# Patient Record
Sex: Male | Born: 1975 | Race: White | Hispanic: No | Marital: Single
Health system: Southern US, Community
[De-identification: ages and names within clinical notes are randomized; demographics above are authoritative.]

---

## 2006-06-28 ENCOUNTER — Emergency Department (HOSPITAL_COMMUNITY): Admission: EM | Admit: 2006-06-28 | Discharge: 2006-06-28 | Payer: Self-pay | Admitting: Emergency Medicine

## 2008-05-21 ENCOUNTER — Emergency Department (HOSPITAL_COMMUNITY): Admission: EM | Admit: 2008-05-21 | Discharge: 2008-05-22 | Payer: Self-pay | Admitting: Emergency Medicine

## 2010-08-05 ENCOUNTER — Emergency Department (HOSPITAL_COMMUNITY): Admission: EM | Admit: 2010-08-05 | Discharge: 2010-08-06 | Payer: Self-pay | Admitting: Emergency Medicine

## 2010-11-25 LAB — CBC
HCT: 37.5 % — ABNORMAL LOW (ref 39.0–52.0)
Hemoglobin: 13.8 g/dL (ref 13.0–17.0)
MCH: 32.4 pg (ref 26.0–34.0)
MCHC: 36.8 g/dL — ABNORMAL HIGH (ref 30.0–36.0)
MCV: 88 fL (ref 78.0–100.0)
Platelets: 192 10*3/uL (ref 150–400)
RBC: 4.26 MIL/uL (ref 4.22–5.81)
RDW: 12.5 % (ref 11.5–15.5)
WBC: 10.2 10*3/uL (ref 4.0–10.5)

## 2010-11-25 LAB — DIFFERENTIAL
Basophils Absolute: 0 10*3/uL (ref 0.0–0.1)
Basophils Relative: 0 % (ref 0–1)
Eosinophils Absolute: 0.1 10*3/uL (ref 0.0–0.7)
Eosinophils Relative: 1 % (ref 0–5)
Lymphocytes Relative: 44 % (ref 12–46)
Lymphs Abs: 4.5 10*3/uL — ABNORMAL HIGH (ref 0.7–4.0)
Monocytes Absolute: 0.7 10*3/uL (ref 0.1–1.0)
Monocytes Relative: 7 % (ref 3–12)
Neutro Abs: 4.8 10*3/uL (ref 1.7–7.7)
Neutrophils Relative %: 47 % (ref 43–77)

## 2010-11-25 LAB — RAPID URINE DRUG SCREEN, HOSP PERFORMED
Amphetamines: NOT DETECTED
Barbiturates: NOT DETECTED
Benzodiazepines: POSITIVE — AB
Cocaine: NOT DETECTED
Opiates: POSITIVE — AB
Tetrahydrocannabinol: POSITIVE — AB

## 2010-11-25 LAB — URINALYSIS, ROUTINE W REFLEX MICROSCOPIC
Bilirubin Urine: NEGATIVE
Glucose, UA: NEGATIVE mg/dL
Hgb urine dipstick: NEGATIVE
Ketones, ur: NEGATIVE mg/dL
Nitrite: NEGATIVE
Protein, ur: NEGATIVE mg/dL
Specific Gravity, Urine: 1.01 (ref 1.005–1.030)
Urobilinogen, UA: 0.2 mg/dL (ref 0.0–1.0)
pH: 5.5 (ref 5.0–8.0)

## 2010-11-25 LAB — BASIC METABOLIC PANEL WITH GFR
CO2: 28 meq/L (ref 19–32)
Calcium: 8.1 mg/dL — ABNORMAL LOW (ref 8.4–10.5)
GFR calc Af Amer: >60 mL/min (ref >60)
GFR calc non Af Amer: >60 mL/min (ref >60)
Sodium: 147 meq/L — ABNORMAL HIGH (ref 135–145)

## 2010-11-25 LAB — BASIC METABOLIC PANEL
BUN: 6 mg/dL (ref 6–23)
Chloride: 114 mEq/L — ABNORMAL HIGH (ref 96–112)
Creatinine, Ser: 0.7 mg/dL (ref 0.4–1.5)
Glucose, Bld: 107 mg/dL — ABNORMAL HIGH (ref 70–99)
Potassium: 4.5 mEq/L (ref 3.5–5.1)

## 2010-11-25 LAB — ETHANOL: Alcohol, Ethyl (B): 203 mg/dL — ABNORMAL HIGH (ref 0–10)

## 2011-06-17 LAB — CBC
HCT: 43.7
Hemoglobin: 15.2
MCHC: 34.8
MCV: 95.2
Platelets: 256
RBC: 4.59
RDW: 13.1
WBC: 9.2

## 2011-06-17 LAB — BASIC METABOLIC PANEL WITH GFR
CO2: 24
Chloride: 111
Glucose, Bld: 110 — ABNORMAL HIGH
Potassium: 3.3 — ABNORMAL LOW
Sodium: 141

## 2011-06-17 LAB — POCT CARDIAC MARKERS
CKMB, poc: <1 — ABNORMAL LOW
Myoglobin, poc: 52.2
Troponin i, poc: <0.05

## 2011-06-17 LAB — DIFFERENTIAL
Basophils Absolute: 0.1
Basophils Relative: 1
Eosinophils Absolute: 0.1
Eosinophils Relative: 2
Lymphocytes Relative: 37
Lymphs Abs: 3.4
Monocytes Absolute: 0.6
Monocytes Relative: 6
Neutro Abs: 4.9
Neutrophils Relative %: 54

## 2011-06-17 LAB — BASIC METABOLIC PANEL
BUN: 3 — ABNORMAL LOW
Calcium: 8.3 — ABNORMAL LOW
Creatinine, Ser: 0.63
GFR calc Af Amer: >60
GFR calc non Af Amer: >60

## 2013-02-24 ENCOUNTER — Emergency Department: Payer: Self-pay | Admitting: Emergency Medicine

## 2013-02-24 LAB — COMPREHENSIVE METABOLIC PANEL WITH GFR
Albumin: 4.1 g/dL (ref 3.4–5.0)
Alkaline Phosphatase: 93 U/L (ref 50–136)
Anion Gap: 8 (ref 7–16)
BUN: 10 mg/dL (ref 7–18)
Bilirubin,Total: 0.5 mg/dL (ref 0.2–1.0)
Calcium, Total: 8.7 mg/dL (ref 8.5–10.1)
Chloride: 105 mmol/L (ref 98–107)
Co2: 26 mmol/L (ref 21–32)
Creatinine: 0.86 mg/dL (ref 0.60–1.30)
EGFR (African American): >60
EGFR (Non-African Amer.): >60
Glucose: 87 mg/dL (ref 65–99)
Osmolality: 276 (ref 275–301)
Potassium: 3.7 mmol/L (ref 3.5–5.1)
SGOT(AST): 22 U/L (ref 15–37)
SGPT (ALT): 18 U/L (ref 12–78)
Sodium: 139 mmol/L (ref 136–145)
Total Protein: 7.5 g/dL (ref 6.4–8.2)

## 2013-02-24 LAB — ACETAMINOPHEN LEVEL: Acetaminophen: 2 ug/mL — ABNORMAL LOW

## 2013-02-24 LAB — SALICYLATE LEVEL: Salicylates, Serum: 4.3 mg/dL — ABNORMAL HIGH

## 2013-02-24 LAB — TSH: Thyroid Stimulating Horm: 4.07 u[IU]/mL

## 2013-02-24 LAB — DRUG SCREEN, URINE
Amphetamines, Ur Screen: NEGATIVE (ref ?–1000)
Barbiturates, Ur Screen: NEGATIVE (ref ?–200)
Benzodiazepine, Ur Scrn: POSITIVE (ref ?–200)
Cannabinoid 50 Ng, Ur ~~LOC~~: POSITIVE (ref ?–50)
Cocaine Metabolite,Ur ~~LOC~~: NEGATIVE (ref ?–300)
MDMA (Ecstasy)Ur Screen: NEGATIVE (ref ?–500)
Methadone, Ur Screen: POSITIVE (ref ?–300)
Opiate, Ur Screen: POSITIVE (ref ?–300)
Phencyclidine (PCP) Ur S: NEGATIVE (ref ?–25)
Tricyclic, Ur Screen: NEGATIVE (ref ?–1000)

## 2013-02-24 LAB — ETHANOL
Ethanol %: <0.003 % (ref 0.000–0.080)
Ethanol: <3 mg/dL

## 2013-02-24 LAB — CBC
HCT: 45 % (ref 40.0–52.0)
HGB: 15.8 g/dL (ref 13.0–18.0)
MCH: 32.3 pg (ref 26.0–34.0)
MCHC: 35.2 g/dL (ref 32.0–36.0)
MCV: 92 fL (ref 80–100)
Platelet: 234 10*3/uL (ref 150–440)
RBC: 4.9 10*6/uL (ref 4.40–5.90)
RDW: 13.6 % (ref 11.5–14.5)
WBC: 8.9 10*3/uL (ref 3.8–10.6)

## 2021-12-09 ENCOUNTER — Inpatient Hospital Stay (HOSPITAL_COMMUNITY)
Admission: EM | Admit: 2021-12-09 | Discharge: 2021-12-13 | DRG: 871 | Disposition: E | Payer: Self-pay | Attending: Internal Medicine | Admitting: Internal Medicine

## 2021-12-09 ENCOUNTER — Encounter (HOSPITAL_COMMUNITY): Payer: Self-pay | Admitting: Internal Medicine

## 2021-12-09 ENCOUNTER — Emergency Department (HOSPITAL_COMMUNITY): Payer: Self-pay

## 2021-12-09 DIAGNOSIS — E871 Hypo-osmolality and hyponatremia: Secondary | ICD-10-CM | POA: Diagnosis present

## 2021-12-09 DIAGNOSIS — Z87891 Personal history of nicotine dependence: Secondary | ICD-10-CM

## 2021-12-09 DIAGNOSIS — E43 Unspecified severe protein-calorie malnutrition: Secondary | ICD-10-CM

## 2021-12-09 DIAGNOSIS — Z66 Do not resuscitate: Secondary | ICD-10-CM | POA: Diagnosis not present

## 2021-12-09 DIAGNOSIS — R32 Unspecified urinary incontinence: Secondary | ICD-10-CM | POA: Diagnosis present

## 2021-12-09 DIAGNOSIS — E872 Acidosis, unspecified: Secondary | ICD-10-CM | POA: Diagnosis present

## 2021-12-09 DIAGNOSIS — J9601 Acute respiratory failure with hypoxia: Secondary | ICD-10-CM | POA: Diagnosis present

## 2021-12-09 DIAGNOSIS — K7200 Acute and subacute hepatic failure without coma: Secondary | ICD-10-CM | POA: Diagnosis present

## 2021-12-09 DIAGNOSIS — Z681 Body mass index (BMI) 19 or less, adult: Secondary | ICD-10-CM

## 2021-12-09 DIAGNOSIS — D696 Thrombocytopenia, unspecified: Secondary | ICD-10-CM | POA: Diagnosis present

## 2021-12-09 DIAGNOSIS — F1110 Opioid abuse, uncomplicated: Secondary | ICD-10-CM | POA: Diagnosis present

## 2021-12-09 DIAGNOSIS — F1910 Other psychoactive substance abuse, uncomplicated: Secondary | ICD-10-CM

## 2021-12-09 DIAGNOSIS — Z7151 Drug abuse counseling and surveillance of drug abuser: Secondary | ICD-10-CM

## 2021-12-09 DIAGNOSIS — N12 Tubulo-interstitial nephritis, not specified as acute or chronic: Secondary | ICD-10-CM | POA: Diagnosis present

## 2021-12-09 DIAGNOSIS — A419 Sepsis, unspecified organism: Principal | ICD-10-CM | POA: Diagnosis present

## 2021-12-09 DIAGNOSIS — J189 Pneumonia, unspecified organism: Secondary | ICD-10-CM | POA: Diagnosis present

## 2021-12-09 DIAGNOSIS — E861 Hypovolemia: Secondary | ICD-10-CM | POA: Diagnosis present

## 2021-12-09 DIAGNOSIS — R159 Full incontinence of feces: Secondary | ICD-10-CM | POA: Diagnosis present

## 2021-12-09 DIAGNOSIS — G9340 Encephalopathy, unspecified: Secondary | ICD-10-CM

## 2021-12-09 DIAGNOSIS — E8809 Other disorders of plasma-protein metabolism, not elsewhere classified: Secondary | ICD-10-CM | POA: Diagnosis present

## 2021-12-09 DIAGNOSIS — G9341 Metabolic encephalopathy: Secondary | ICD-10-CM | POA: Diagnosis not present

## 2021-12-09 DIAGNOSIS — J9602 Acute respiratory failure with hypercapnia: Secondary | ICD-10-CM | POA: Diagnosis present

## 2021-12-09 DIAGNOSIS — J850 Gangrene and necrosis of lung: Secondary | ICD-10-CM | POA: Diagnosis present

## 2021-12-09 DIAGNOSIS — R6521 Severe sepsis with septic shock: Secondary | ICD-10-CM | POA: Diagnosis present

## 2021-12-09 DIAGNOSIS — Z20822 Contact with and (suspected) exposure to covid-19: Secondary | ICD-10-CM | POA: Diagnosis present

## 2021-12-09 DIAGNOSIS — E8729 Other acidosis: Secondary | ICD-10-CM | POA: Diagnosis present

## 2021-12-09 DIAGNOSIS — E876 Hypokalemia: Secondary | ICD-10-CM | POA: Diagnosis present

## 2021-12-09 LAB — COMPREHENSIVE METABOLIC PANEL
ALT: 38 U/L (ref 0–44)
AST: 106 U/L — ABNORMAL HIGH (ref 15–41)
Albumin: <1.5 g/dL — ABNORMAL LOW (ref 3.5–5.0)
Alkaline Phosphatase: 54 U/L (ref 38–126)
Anion gap: 18 — ABNORMAL HIGH (ref 5–15)
BUN: 38 mg/dL — ABNORMAL HIGH (ref 6–20)
CO2: 23 mmol/L (ref 22–32)
Calcium: 6.6 mg/dL — ABNORMAL LOW (ref 8.9–10.3)
Chloride: 87 mmol/L — ABNORMAL LOW (ref 98–111)
Creatinine, Ser: 1.06 mg/dL (ref 0.61–1.24)
GFR, Estimated: >60 mL/min (ref >60)
Glucose, Bld: 124 mg/dL — ABNORMAL HIGH (ref 70–99)
Potassium: 3 mmol/L — ABNORMAL LOW (ref 3.5–5.1)
Sodium: 128 mmol/L — ABNORMAL LOW (ref 135–145)
Total Bilirubin: 1.5 mg/dL — ABNORMAL HIGH (ref 0.3–1.2)
Total Protein: 4.6 g/dL — ABNORMAL LOW (ref 6.5–8.1)

## 2021-12-09 LAB — LACTIC ACID, PLASMA
Lactic Acid, Venous: 7.9 mmol/L (ref 0.5–1.9)
Lactic Acid, Venous: 5.4 mmol/L (ref 0.5–1.9)

## 2021-12-09 LAB — CBC WITH DIFFERENTIAL/PLATELET
Band Neutrophils: 47 %
Basophils Absolute: 0 10*3/uL (ref 0.0–0.1)
Basophils Relative: 0 %
Eosinophils Absolute: 0 10*3/uL (ref 0.0–0.5)
Eosinophils Relative: 0 %
HCT: 39.6 % (ref 39.0–52.0)
Hemoglobin: 13.6 g/dL (ref 13.0–17.0)
Lymphocytes Relative: 8 %
Lymphs Abs: 1.2 10*3/uL (ref 0.7–4.0)
MCH: 31.5 pg (ref 26.0–34.0)
MCHC: 34.3 g/dL (ref 30.0–36.0)
MCV: 91.7 fL (ref 80.0–100.0)
Metamyelocytes Relative: 9 %
Monocytes Absolute: 0.7 10*3/uL (ref 0.1–1.0)
Monocytes Relative: 5 %
Myelocytes: 4 %
Neutro Abs: 10.9 10*3/uL — ABNORMAL HIGH (ref 1.7–7.7)
Neutrophils Relative %: 26 %
Platelets: 102 10*3/uL — ABNORMAL LOW (ref 150–400)
Promyelocytes Relative: 1 %
RBC: 4.32 MIL/uL (ref 4.22–5.81)
RDW: 13.7 % (ref 11.5–15.5)
WBC: 14.9 10*3/uL — ABNORMAL HIGH (ref 4.0–10.5)
nRBC: 0 % (ref 0.0–0.2)

## 2021-12-09 LAB — BLOOD GAS, VENOUS
Acid-base deficit: 5.2 mmol/L — ABNORMAL HIGH (ref 0.0–2.0)
Bicarbonate: 20.6 mmol/L (ref 20.0–28.0)
Drawn by: 61882
FIO2: 80 %
O2 Saturation: 89 %
Patient temperature: 36.3
pCO2, Ven: 39 mmHg — ABNORMAL LOW (ref 44–60)
pH, Ven: 7.33 (ref 7.25–7.43)
pO2, Ven: 60 mmHg — ABNORMAL HIGH (ref 32–45)

## 2021-12-09 LAB — GLUCOSE, CAPILLARY: Glucose-Capillary: 103 mg/dL — ABNORMAL HIGH (ref 70–99)

## 2021-12-09 LAB — BLOOD GAS, ARTERIAL
Acid-base deficit: 6.1 mmol/L — ABNORMAL HIGH (ref 0.0–2.0)
Acid-base deficit: 2.2 mmol/L — ABNORMAL HIGH (ref 0.0–2.0)
Bicarbonate: 25.1 mmol/L (ref 20.0–28.0)
Bicarbonate: 29.6 mmol/L — ABNORMAL HIGH (ref 20.0–28.0)
Drawn by: 27733
Drawn by: 35043
FIO2: 100 %
FIO2: 100 %
O2 Saturation: 89.8 %
O2 Saturation: 90.1 %
Patient temperature: 35.5
Patient temperature: 36.4
pCO2 arterial: 76 mmHg (ref 32–48)
pCO2 arterial: 89 mmHg (ref 32–48)
pH, Arterial: 7.12 — CL (ref 7.35–7.45)
pH, Arterial: 7.13 — CL (ref 7.35–7.45)
pO2, Arterial: 64 mmHg — ABNORMAL LOW (ref 83–108)
pO2, Arterial: 68 mmHg — ABNORMAL LOW (ref 83–108)

## 2021-12-09 LAB — POCT I-STAT 7, (LYTES, BLD GAS, ICA,H+H)
Acid-Base Excess: 2 mmol/L (ref 0.0–2.0)
Bicarbonate: 33.5 mmol/L — ABNORMAL HIGH (ref 20.0–28.0)
Calcium, Ion: 1.05 mmol/L — ABNORMAL LOW (ref 1.15–1.40)
HCT: 40 % (ref 39.0–52.0)
Hemoglobin: 13.6 g/dL (ref 13.0–17.0)
O2 Saturation: 80 %
Patient temperature: 36.2
Potassium: 3.7 mmol/L (ref 3.5–5.1)
Sodium: 133 mmol/L — ABNORMAL LOW (ref 135–145)
TCO2: 36 mmol/L — ABNORMAL HIGH (ref 22–32)
pCO2 arterial: 93.2 mmHg (ref 32–48)
pH, Arterial: 7.159 — CL (ref 7.35–7.45)
pO2, Arterial: 57 mmHg — ABNORMAL LOW (ref 83–108)

## 2021-12-09 LAB — APTT: aPTT: 32 seconds (ref 24–36)

## 2021-12-09 LAB — RESP PANEL BY RT-PCR (FLU A&B, COVID) ARPGX2
Influenza A by PCR: NEGATIVE
Influenza B by PCR: NEGATIVE
SARS Coronavirus 2 by RT PCR: NEGATIVE

## 2021-12-09 LAB — PROTIME-INR
INR: 1.3 — ABNORMAL HIGH (ref 0.8–1.2)
Prothrombin Time: 16 seconds — ABNORMAL HIGH (ref 11.4–15.2)

## 2021-12-09 MED ORDER — POTASSIUM CHLORIDE 10 MEQ/100ML IV SOLN: 10.0000 meq | INTRAVENOUS | Status: AC

## 2021-12-09 MED ORDER — SODIUM BICARBONATE 8.4 % IV SOLN
50.0000 meq | Freq: Once | INTRAVENOUS | Status: AC
  Administered 2021-12-09: 50 meq via INTRAVENOUS

## 2021-12-09 MED ORDER — FENTANYL CITRATE (PF) 100 MCG/2ML IJ SOLN: 50.0000 ug | INTRAMUSCULAR | Status: DC | PRN

## 2021-12-09 MED ORDER — SODIUM CHLORIDE 0.9 % IV SOLN: 2.0000 g | Freq: Three times a day (TID) | INTRAVENOUS | Status: DC

## 2021-12-09 MED ORDER — DOCUSATE SODIUM 100 MG PO CAPS: 100.0000 mg | ORAL_CAPSULE | Freq: Two times a day (BID) | ORAL | Status: DC | PRN

## 2021-12-09 MED ORDER — METHYLPREDNISOLONE SODIUM SUCC 125 MG IJ SOLR
125.0000 mg | Freq: Once | INTRAMUSCULAR | Status: AC
  Administered 2021-12-09: 125 mg via INTRAVENOUS
  Filled 2021-12-09: qty 2

## 2021-12-09 MED ORDER — IOHEXOL 300 MG/ML  SOLN
100.0000 mL | Freq: Once | INTRAMUSCULAR | Status: AC | PRN
  Administered 2021-12-09: 80 mL via INTRAVENOUS

## 2021-12-09 MED ORDER — LACTATED RINGERS IV SOLN: INTRAVENOUS | Status: DC

## 2021-12-09 MED ORDER — LACTATED RINGERS IV BOLUS (SEPSIS)
500.0000 mL | Freq: Once | INTRAVENOUS | Status: AC
  Administered 2021-12-09: 500 mL via INTRAVENOUS

## 2021-12-09 MED ORDER — POLYETHYLENE GLYCOL 3350 17 G PO PACK: 17.0000 g | PACK | Freq: Every day | ORAL | Status: DC | PRN

## 2021-12-09 MED ORDER — ALBUMIN HUMAN 25 % IV SOLN
25.0000 g | Freq: Once | INTRAVENOUS | Status: DC
  Filled 2021-12-09: qty 100

## 2021-12-09 MED ORDER — ORAL CARE MOUTH RINSE
15.0000 mL | OROMUCOSAL | Status: DC
  Administered 2021-12-10 (×4): 15 mL via OROMUCOSAL

## 2021-12-09 MED ORDER — VANCOMYCIN HCL IN DEXTROSE 1-5 GM/200ML-% IV SOLN: 1000.0000 mg | INTRAVENOUS | Status: DC

## 2021-12-09 MED ORDER — PROPOFOL 1000 MG/100ML IV EMUL
5.0000 ug/kg/min | INTRAVENOUS | Status: DC
  Administered 2021-12-09: 10 ug/kg/min via INTRAVENOUS
  Administered 2021-12-10: 15 ug/kg/min via INTRAVENOUS
  Filled 2021-12-09 (×2): qty 100

## 2021-12-09 MED ORDER — PHENYLEPHRINE 200 MCG/ML FOR PRIAPISM / HYPOTENSION
INTRAMUSCULAR | Status: AC
Start: 2021-12-09 — End: 2021-12-10
  Filled 2021-12-09: qty 50

## 2021-12-09 MED ORDER — ROCURONIUM BROMIDE 50 MG/5ML IV SOLN: 100.0000 mg | Freq: Once | INTRAVENOUS | Status: AC

## 2021-12-09 MED ORDER — EPINEPHRINE HCL 5 MG/250ML IV SOLN IN NS
0.5000 ug/min | INTRAVENOUS | Status: DC
  Filled 2021-12-09: qty 250

## 2021-12-09 MED ORDER — THIAMINE HCL 100 MG/ML IJ SOLN: 100.0000 mg | Freq: Every day | INTRAMUSCULAR | Status: DC

## 2021-12-09 MED ORDER — VANCOMYCIN HCL IN DEXTROSE 1-5 GM/200ML-% IV SOLN
1000.0000 mg | Freq: Once | INTRAVENOUS | Status: AC
  Administered 2021-12-09: 1000 mg via INTRAVENOUS
  Filled 2021-12-09: qty 200

## 2021-12-09 MED ORDER — PIPERACILLIN-TAZOBACTAM 3.375 G IVPB
3.3750 g | Freq: Three times a day (TID) | INTRAVENOUS | Status: DC
  Administered 2021-12-10 (×2): 3.375 g via INTRAVENOUS
  Filled 2021-12-09 (×2): qty 50

## 2021-12-09 MED ORDER — KETAMINE HCL 50 MG/5ML IJ SOSY: 1.0000 mg/kg | PREFILLED_SYRINGE | Freq: Once | INTRAMUSCULAR | Status: AC

## 2021-12-09 MED ORDER — PHENYLEPHRINE HCL (PRESSORS) 10 MG/ML IV SOLN
INTRAVENOUS | Status: AC | PRN
Start: 2021-12-09 — End: 2021-12-09
  Administered 2021-12-09: 120 ug via INTRAVENOUS

## 2021-12-09 MED ORDER — FOLIC ACID 5 MG/ML IJ SOLN
1.0000 mg | Freq: Every day | INTRAMUSCULAR | Status: DC
  Filled 2021-12-09: qty 0.2

## 2021-12-09 MED ORDER — KETAMINE HCL 50 MG/5ML IJ SOSY
PREFILLED_SYRINGE | INTRAMUSCULAR | Status: AC
Start: 2021-12-09 — End: 2021-12-10
  Filled 2021-12-09: qty 5

## 2021-12-09 MED ORDER — CHLORHEXIDINE GLUCONATE 0.12% ORAL RINSE (MEDLINE KIT)
15.0000 mL | Freq: Two times a day (BID) | OROMUCOSAL | Status: DC
  Administered 2021-12-09: 15 mL via OROMUCOSAL

## 2021-12-09 MED ORDER — ROCURONIUM BROMIDE 10 MG/ML (PF) SYRINGE
PREFILLED_SYRINGE | INTRAVENOUS | Status: AC
  Administered 2021-12-09: 100 mg via INTRAVENOUS
  Filled 2021-12-09: qty 10

## 2021-12-09 MED ORDER — METRONIDAZOLE 500 MG/100ML IV SOLN
500.0000 mg | Freq: Once | INTRAVENOUS | Status: AC
  Administered 2021-12-09: 500 mg via INTRAVENOUS
  Filled 2021-12-09: qty 100

## 2021-12-09 MED ORDER — PHENYLEPHRINE HCL-NACL 20-0.9 MG/250ML-% IV SOLN
INTRAVENOUS | Status: AC
  Filled 2021-12-09: qty 250

## 2021-12-09 MED ORDER — PHENYLEPHRINE HCL (PRESSORS) 10 MG/ML IV SOLN
INTRAVENOUS | Status: AC
  Filled 2021-12-09: qty 1

## 2021-12-09 MED ORDER — NOREPINEPHRINE 4 MG/250ML-% IV SOLN
0.0000 ug/min | INTRAVENOUS | Status: DC
  Administered 2021-12-09: 2 ug/min via INTRAVENOUS
  Administered 2021-12-09: 5 ug/min via INTRAVENOUS
  Administered 2021-12-10: 20 ug/min via INTRAVENOUS
  Administered 2021-12-10 (×2): 40 ug/min via INTRAVENOUS
  Filled 2021-12-09 (×5): qty 250

## 2021-12-09 MED ORDER — PHENYLEPHRINE 40 MCG/ML (10ML) SYRINGE FOR IV PUSH (FOR BLOOD PRESSURE SUPPORT)
PREFILLED_SYRINGE | INTRAVENOUS | Status: AC
Start: 2021-12-09 — End: 2021-12-10
  Filled 2021-12-09: qty 10

## 2021-12-09 MED ORDER — VECURONIUM BROMIDE 10 MG IV SOLR
10.0000 mg | Freq: Once | INTRAVENOUS | Status: AC
  Administered 2021-12-09: 10 mg via INTRAVENOUS
  Filled 2021-12-09: qty 10

## 2021-12-09 MED ORDER — PHENYLEPHRINE 40 MCG/ML (10ML) SYRINGE FOR IV PUSH (FOR BLOOD PRESSURE SUPPORT)
80.0000 ug | PREFILLED_SYRINGE | Freq: Once | INTRAVENOUS | Status: AC | PRN
  Administered 2021-12-09: 120 ug via INTRAVENOUS
  Filled 2021-12-09: qty 10

## 2021-12-09 MED ORDER — KETAMINE HCL 50 MG/5ML IJ SOSY
PREFILLED_SYRINGE | INTRAMUSCULAR | Status: AC
  Administered 2021-12-09: 60 mg via INTRAVENOUS
  Filled 2021-12-09: qty 5

## 2021-12-09 MED ORDER — VASOPRESSIN 20 UNITS/100 ML INFUSION FOR SHOCK
0.0000 [IU]/min | INTRAVENOUS | Status: DC
  Administered 2021-12-09: 0.03 [IU]/min via INTRAVENOUS
  Filled 2021-12-09: qty 100

## 2021-12-09 MED ORDER — FENTANYL CITRATE PF 50 MCG/ML IJ SOSY: 50.0000 ug | PREFILLED_SYRINGE | INTRAMUSCULAR | Status: DC | PRN

## 2021-12-09 MED ORDER — POTASSIUM CHLORIDE 20 MEQ PO PACK
40.0000 meq | PACK | Freq: Once | ORAL | Status: AC
  Administered 2021-12-10: 40 meq
  Filled 2021-12-09: qty 2

## 2021-12-09 MED ORDER — PANTOPRAZOLE SODIUM 40 MG IV SOLR
40.0000 mg | Freq: Every day | INTRAVENOUS | Status: DC
  Administered 2021-12-10: 40 mg via INTRAVENOUS
  Filled 2021-12-09: qty 10

## 2021-12-09 MED ORDER — LACTATED RINGERS IV BOLUS (SEPSIS)
250.0000 mL | Freq: Once | INTRAVENOUS | Status: AC
  Administered 2021-12-09: 250 mL via INTRAVENOUS

## 2021-12-09 MED ORDER — LACTATED RINGERS IV BOLUS (SEPSIS)
1000.0000 mL | Freq: Once | INTRAVENOUS | Status: AC
  Administered 2021-12-09: 1000 mL via INTRAVENOUS

## 2021-12-09 MED ORDER — STERILE WATER FOR INJECTION IJ SOLN
INTRAMUSCULAR | Status: AC
  Filled 2021-12-09: qty 10

## 2021-12-09 MED ORDER — CHLORHEXIDINE GLUCONATE CLOTH 2 % EX PADS
6.0000 | MEDICATED_PAD | Freq: Every day | CUTANEOUS | Status: DC
  Administered 2021-12-09: 6 via TOPICAL

## 2021-12-09 MED ORDER — CALCIUM GLUCONATE-NACL 1-0.675 GM/50ML-% IV SOLN
1.0000 g | Freq: Once | INTRAVENOUS | Status: AC
  Administered 2021-12-10: 1000 mg via INTRAVENOUS
  Filled 2021-12-09: qty 50

## 2021-12-09 MED ORDER — SODIUM CHLORIDE 0.9 % IV SOLN
2.0000 g | Freq: Once | INTRAVENOUS | Status: AC
  Administered 2021-12-09: 2 g via INTRAVENOUS
  Filled 2021-12-09: qty 2

## 2021-12-09 NOTE — ED Provider Notes (Signed)
 [facility] EMERGENCY DEPARTMENT Provider Note  CSN: [id number] Arrival date & time: 12/09/21 1524  Chief Complaint(s) Hypotension  HPI Jonathan Mann is a 46 y.o. 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  Patient states that he has been feeling poorly for the last 2-1/2 weeks but he is significantly worsened over the last 24 hours.  He states that he primarily snorts heroin and his last use was last night but did not use this morning.  He states he has not seen a physician in over 10 years.  He endorses urinary and fecal incontinence, chest pain, cough, shortness of breath, fever.  Denies headache, vomiting, abdominal pain or other systemic symptoms.  Patient arrives tachycardic, hypotensive and hypoxic.  HPI  Past Medical History No past medical history on file. There are no problems to display for this patient.  Home Medication(s) Prior to Admission medications   Not on File                                                                                                                                    Past Surgical History  Family History No family history on file.  Social History   Allergies Patient has no allergy information on record.  Review of Systems Review of Systems  Constitutional:  Positive for fatigue.  Respiratory:  Positive for cough and shortness of breath.   Cardiovascular:  Positive for chest pain.  Gastrointestinal:  Positive for diarrhea.   Physical Exam Vital Signs  I have reviewed the triage vital signs BP 118/82   Pulse (!) 116   Temp (!) 97.3 F (36.3 C) (Rectal)   Resp (!) 25   Ht 5' 11 (1.803 m)   Wt 52.7 kg   SpO2 94%   BMI 16.19 kg/m   Physical Exam Vitals and nursing note reviewed.  Constitutional:      General: He is in acute distress.     Appearance: He is well-developed. He is ill-appearing  and toxic-appearing.  HENT:     Head: Normocephalic and atraumatic.  Eyes:     Conjunctiva/sclera: Conjunctivae normal.  Cardiovascular:     Rate and Rhythm: Regular rhythm. Tachycardia present.     Heart sounds: No murmur heard. Pulmonary:     Effort: Respiratory distress present.     Breath sounds: Rales present.     Comments: Decreased breath sounds on the left Abdominal:     Palpations: Abdomen is soft.     Tenderness: There is no abdominal tenderness.  Musculoskeletal:        General: No swelling.     Cervical back: Neck supple.  Skin:    General: Skin is warm and dry.     Capillary Refill: Capillary refill takes less than 2 seconds.  Neurological:  Mental Status: He is alert.  Psychiatric:        Mood and Affect: Mood normal.    ED Results and Treatments Labs (all labs ordered are listed, but only abnormal results are displayed) Labs Reviewed  LACTIC ACID, PLASMA - Abnormal; Notable for the following components:      Result Value   Lactic Acid, Venous 7.9 (*)    All other components within normal limits  COMPREHENSIVE METABOLIC PANEL - Abnormal; Notable for the following components:   Sodium 128 (*)    Potassium 3.0 (*)    Chloride 87 (*)    Glucose, Bld 124 (*)    BUN 38 (*)    Calcium  6.6 (*)    Total Protein 4.6 (*)    Albumin  <1.5 (*)    AST 106 (*)    Total Bilirubin 1.5 (*)    Anion gap 18 (*)    All other components within normal limits  CBC WITH DIFFERENTIAL/PLATELET - Abnormal; Notable for the following components:   WBC 14.9 (*)    Platelets 102 (*)    Neutro Abs 10.9 (*)    All other components within normal limits  PROTIME-INR - Abnormal; Notable for the following components:   Prothrombin Time 16.0 (*)    INR 1.3 (*)    All other components within normal limits  BLOOD GAS, VENOUS - Abnormal; Notable for the following components:   pCO2, Ven 39 (*)    pO2, Ven 60 (*)    Acid-base deficit 5.2 (*)    All other components within normal  limits  CULTURE, BLOOD (ROUTINE X 2)  CULTURE, BLOOD (ROUTINE X 2)  RESP PANEL BY RT-PCR (FLU A&B, COVID) ARPGX2  URINE CULTURE  APTT  LACTIC ACID, PLASMA  URINALYSIS, ROUTINE W REFLEX MICROSCOPIC  BLOOD GAS, ARTERIAL                                                                                                                          Radiology DG Chest Port 1 View  Result Date: 12/09/2021 CLINICAL DATA:  Provided history: Questionable sepsis, evaluate for abnormality. Additional history provided: Severe shortness of breath, decreased O2 sats, hypotension. EXAM: PORTABLE CHEST 1 VIEW COMPARISON:  Prior chest radiographs 05/22/2008 and earlier. FINDINGS: Portions of the lateral costophrenic angles are excluded from the field of view, bilaterally. Heart size within normal limits. Extensive opacity throughout the left upper and mid lung fields. Additional patchy airspace disease within the right mid lung and bilateral lung bases. Right upper lobe scarring and possible right apical bullae. No appreciable pleural effusion. No evidence of pneumothorax. No acute bony abnormality identified. IMPRESSION: Portions of the lateral costophrenic angles are excluded from the field of view, bilaterally. Extensive opacity throughout the left upper and mid lung fields. Additional patchy airspace disease within the right mid lung and bilateral lung bases. Given the provided history, findings likely reflect multifocal pneumonia. However, radiographic follow-up to complete resolution is recommended to exclude underlying  malignancy. Right upper lobe scarring with possible right apical bullae. Electronically Signed   By: Bascom Lily D.O.   On: 12/09/2021 16:07   DG Chest Port 1V same Day  Result Date: 12/09/2021 CLINICAL DATA:  Pneumonia EXAM: PORTABLE CHEST 1 VIEW COMPARISON:  Previous studies including the examination done earlier today FINDINGS: Extensive infiltrates are seen in the left upper and left mid lung  fields. There are scattered small patchy infiltrates in the right parahilar region and both lower lung fields. Tip of endotracheal tube is approximately 5.1 cm above the carina. Costophrenic angles are clear. There is no pneumothorax. IMPRESSION: Extensive infiltrates are seen in both lungs as described with no significant interval change. There is no significant pleural effusion. Tip endotracheal tube is 5.1 cm above the carina. Electronically Signed   By: Craven Do M.D.   On: 12/09/2021 17:14    Pertinent labs & imaging results that were available during my care of the patient were reviewed by me and considered in my medical decision making (see MDM for details).  Medications Ordered in ED Medications  lactated ringers  infusion ( Intravenous New Bag/Given 12/09/21 1710)  vancomycin  (VANCOCIN ) IVPB 1000 mg/200 mL premix (1,000 mg Intravenous New Bag/Given 12/09/21 1734)  norepinephrine  (LEVOPHED ) 4mg  in (0.016 mg/mL) premix infusion (2 mcg/min Intravenous New Bag/Given 12/09/21 1702)  propofol  (DIPRIVAN ) 1000 MG/100ML infusion (10 mcg/kg/min  52.7 kg Intravenous New Bag/Given 12/09/21 1717)  fentaNYL  (SUBLIMAZE ) injection 50 mcg (has no administration in time range)  ketamine  HCl 50 MG/5ML SOSY (has no administration in time range)  albumin  human 25 % solution 25 g (has no administration in time range)  potassium chloride  10 mEq in 100 mL IVPB (has no administration in time range)  lactated ringers  bolus 1,000 mL (1,000 mLs Intravenous Bolus 12/09/21 1600)    And  lactated ringers  bolus 500 mL (500 mLs Intravenous Bolus 12/09/21 1615)    And  lactated ringers  bolus 250 mL (0 mLs Intravenous Stopped 12/09/21 1721)  ceFEPIme  (MAXIPIME ) 2 g in sodium chloride  0.9 % 100 mL IVPB (0 g Intravenous Stopped 12/09/21 1634)  metroNIDAZOLE  (FLAGYL ) IVPB 500 mg (0 mg Intravenous Stopped 12/09/21 1731)  ketamine  50 mg in normal saline 5 mL (10 mg/mL) syringe (60 mg Intravenous Given by Other 12/09/21  1652)  rocuronium  (ZEMURON ) injection 100 mg (100 mg Intravenous Given by Other 12/09/21 1653)  PHENYLephrine  40 mcg/ml in normal saline Adult IV Push Syringe (For Blood Pressure Support) (120 mcg Intravenous Given by Other 12/09/21 1652)  phenylephrine  (NEO-SYNEPHRINE) injection (120 mcg Intravenous Given 12/09/21 1655)                                                                                                                                     Procedures .Critical Care Performed by: Karlyn Overman, MD Authorized by: Karlyn Overman, MD   Critical care provider statement:    Critical care time (minutes):  75   Critical care was time spent personally by me on the following activities:  Development of treatment plan with patient or surrogate, discussions with consultants, evaluation of patient's response to treatment, examination of patient, ordering and review of laboratory studies, ordering and review of radiographic studies, ordering and performing treatments and interventions, pulse oximetry, re-evaluation of patient's condition and review of old charts Procedure Name: Intubation Date/Time: 12/09/2021 5:49 PM Performed by: Karlyn Overman, MD Pre-anesthesia Checklist: Patient identified, Patient being monitored, Emergency Drugs available, Timeout performed and Suction available Oxygen Delivery Method: Non-rebreather mask Preoxygenation: Pre-oxygenation with 100% oxygen Induction Type: Rapid sequence Ventilation: Mask ventilation without difficulty Laryngoscope Size: Glidescope and 3 Tube size: 7.5 mm Number of attempts: 1 Airway Equipment and Method: Video-laryngoscopy Placement Confirmation: ETT inserted through vocal cords under direct vision, CO2 detector and Breath sounds checked- equal and bilateral     (including critical care time)  Medical Decision Making / ED Course   This patient presents to the ED for concern of hypoxia, hypotension, cough, this involves an  extensive number of treatment options, and is a complaint that carries with it a high risk of complications and morbidity.  The differential diagnosis includes sepsis, pneumonia, intra-abdominal infection, PE, septic shock, cardiogenic shock, hypovolemic shock  MDM: Patient seen emergency Lamona Pilon for evaluation of multiple complaints as described above.  Physical exam reveals a cachectic appearing patient with decreased breath sounds on the right, rales on the right, increased accessory muscle use and tachypnea.  Patient arrives on a nonrebreather hypoxic into the low 80s but ultimately improved into the mid 90s.  Unfortunately, work of breathing still significant and he was transitioned to BiPAP.  Initial pH 7.3 with a PCO2 of 39.  White blood cell count 14.9, sodium 128, potassium 3.0, chloride 87, creatinine surprisingly normal at 1.06, albumin  nondetectable at less than 1.5, hypocalcemia to 6.6 AST elevated to 106, T. bili 1.5, INR 1.3.  Blood cultures obtained and broad-spectrum antibiotics begun as well as resuscitation with 30 cc/kg lactated Ringer 's.  Initial lactate 7.9.  Unfortunately, patient with persistent tachypnea and increased work of breathing on the BiPAP and required intubation.  Patient with softer blood pressures in the low 90s prior to intubation and thus patient inducted with ketamine  and Neo-Synephrine push.  Intubation successful and postintubation had some mild hypotension prompting another dose of Neo-Synephrine and Levophed  initiation.  Chest x-ray with large right-sided pneumonia.  Chest abdomen pelvis CT is currently pending.  Patient require ICU admission for septic shock requiring intubation and pressor support.   Additional history obtained: -Additional history obtained from family -External records from outside source obtained and reviewed including: Chart review including previous notes, labs, imaging, consultation notes   Lab Tests: -I ordered, reviewed, and  interpreted labs.   The pertinent results include:   Labs Reviewed  LACTIC ACID, PLASMA - Abnormal; Notable for the following components:      Result Value   Lactic Acid, Venous 7.9 (*)    All other components within normal limits  COMPREHENSIVE METABOLIC PANEL - Abnormal; Notable for the following components:   Sodium 128 (*)    Potassium 3.0 (*)    Chloride 87 (*)    Glucose, Bld 124 (*)    BUN 38 (*)    Calcium  6.6 (*)    Total Protein 4.6 (*)    Albumin  <1.5 (*)    AST 106 (*)    Total Bilirubin 1.5 (*)    Anion gap 18 (*)  All other components within normal limits  CBC WITH DIFFERENTIAL/PLATELET - Abnormal; Notable for the following components:   WBC 14.9 (*)    Platelets 102 (*)    Neutro Abs 10.9 (*)    All other components within normal limits  PROTIME-INR - Abnormal; Notable for the following components:   Prothrombin Time 16.0 (*)    INR 1.3 (*)    All other components within normal limits  BLOOD GAS, VENOUS - Abnormal; Notable for the following components:   pCO2, Ven 39 (*)    pO2, Ven 60 (*)    Acid-base deficit 5.2 (*)    All other components within normal limits  CULTURE, BLOOD (ROUTINE X 2)  CULTURE, BLOOD (ROUTINE X 2)  RESP PANEL BY RT-PCR (FLU A&B, COVID) ARPGX2  URINE CULTURE  APTT  LACTIC ACID, PLASMA  URINALYSIS, ROUTINE W REFLEX MICROSCOPIC  BLOOD GAS, ARTERIAL      EKG   EKG Interpretation  Date/Time:  Tuesday December 09 2021 15:36:26 EDT Ventricular Rate:  127 PR Interval:  122 QRS Duration: 78 QT Interval:  296 QTC Calculation: 431 R Axis:   88 Text Interpretation: Sinus tachycardia Confirmed by Tiaunna Buford ([id number]) on 12/09/2021 6:01:02 PM         Imaging Studies ordered: I ordered imaging studies including CXR, CTCAP I independently visualized and interpreted imaging. I agree with the radiologist interpretation   Medicines ordered and prescription drug management: Meds ordered this encounter  Medications   lactated  ringers  infusion   AND Linked Order Group    lactated ringers  bolus 1,000 mL     Order Specific Question:   Enter Patient Weight in Kilograms     Answer:   54    lactated ringers  bolus 500 mL     Order Specific Question:   Enter Patient Weight in Kilograms     Answer:   54    lactated ringers  bolus 250 mL     Order Specific Question:   Enter Patient Weight in Kilograms     Answer:   54   ceFEPIme  (MAXIPIME ) 2 g in sodium chloride  0.9 % 100 mL IVPB    Order Specific Question:   Antibiotic Indication:    Answer:   Other Indication (list below)    Order Specific Question:   Other Indication:    Answer:   Unknown source   metroNIDAZOLE  (FLAGYL ) IVPB 500 mg    Order Specific Question:   Antibiotic Indication:    Answer:   Other Indication (list below)    Order Specific Question:   Other Indication:    Answer:   Unknown source   vancomycin  (VANCOCIN ) IVPB 1000 mg/200 mL premix    Order Specific Question:   Indication:    Answer:   Other Indication (list below)    Order Specific Question:   Other Indication:    Answer:   Unknown source   ketamine  50 mg in normal saline 5 mL (10 mg/mL) syringe   rocuronium  (ZEMURON ) injection 100 mg   PHENYLephrine  40 mcg/ml in normal saline Adult IV Push Syringe (For Blood Pressure Support)   norepinephrine  (LEVOPHED ) 4mg  in (0.016 mg/mL) premix infusion   propofol  (DIPRIVAN ) 1000 MG/100ML infusion   fentaNYL  (SUBLIMAZE ) injection 50 mcg   rocuronium  bromide 100 MG/10ML SOSY    Zifer, Mary B: cabinet override   ketamine  HCl 50 MG/5ML SOSY    Zifer, Mary B: cabinet override   ketamine  HCl 50 MG/5ML SOSY    Zifer,  Mary B: cabinet override   phenylephrine  (NEO-SYNEPHRINE) injection   albumin  human 25 % solution 25 g   potassium chloride  10 mEq in 100 mL IVPB    -I have reviewed the patients home medicines and have made adjustments as needed  Critical interventions Pressor support, fluids, multiple antibiotics, intubation  Consultations  Obtained: I requested consultation with the intensivist,  and discussed lab and imaging findings as well as pertinent plan - they recommend: ICU admission   Cardiac Monitoring: The patient was maintained on a cardiac monitor.  I personally viewed and interpreted the cardiac monitored which showed an underlying rhythm of: Tachycardia  Social Determinants of Health:  Factors impacting patients care include: Polysubstance abuse   Reevaluation: After the interventions noted above, I reevaluated the patient and found that they have :improved  Co morbidities that complicate the patient evaluation No past medical history on file.    Dispostion: I considered admission for this patient, and due to septic shock requiring intubation and pressor support patient will require admission to the ICU     Final Clinical Impression(s) / ED Diagnoses Final diagnoses:  None     @PCDICTATION @    Karlyn Overman, MD 12/09/21 1801

## 2021-12-09 NOTE — ED Notes (Signed)
Pt desat to 79%. EDP made aware. Pt back to 84%. Pt O2 sat bouncing between 80-85% ?

## 2021-12-09 NOTE — ED Notes (Signed)
Accompanied patient to CT with R.T. Tolerated well.  ?

## 2021-12-09 NOTE — ED Triage Notes (Signed)
Pt arrived via [facility] for low BP and weakness/fatigue as, per EMS, he is unable to get out of bed. BP 78/46. SpO2 90 on non- rebreather. A&Ox4 ?

## 2021-12-09 NOTE — ED Notes (Signed)
[facility] to pick pt up.  ?

## 2021-12-09 NOTE — ED Notes (Signed)
Pt intubated with 7.5 ETT by Dr. Tinnie Gens. Positive color change on CO2 detector. Secured at 25cm at teeth.  ?

## 2021-12-09 NOTE — Progress Notes (Signed)
RR increased to 28 peep increased to 8 per MD ?

## 2021-12-09 NOTE — H&P (Signed)
? ?NAME:  Jonathan Mann, MRN:  [id number], DOB:  11/08/1975, LOS: 0 ?ADMISSION DATE:  12/11/21, CONSULTATION DATE:  2021/12/11 ?REFERRING MD:  Dr. Posey Rea, CHIEF COMPLAINT:  SOB  ? ?History of Present Illness:  ?HPI obtained from medical chart review as patient is intubated and sedated on mechanical ventilation.  ? ?46 year old with only known prior history of IV drug abuse and who does follow with regular medical care who presented to [facility] ER with 2.5 weeks of fatigue and shortness of breath but became significantly worse in last 24 hrs.  Reported primarily snorts heroin, last use was evening of 3/27.  Additionally reported urinary and fecal incontinence, fever, cough, chest pain, and shortness of breath; denied abdominal pain, vomiting, headache.  He arrived by EMS hypotensive, tachycardic, hypoxic, temp 97, and BMI 16.19.  Tried on BiPAP but required intubation for ongoing respiratory distress and worsening hemodynamics.   ? ?Workup notable for CXR with bilateral infiltrates, extensive on the left c/w with multifocal pneumonia and right apical blebs.  Labs notable for WBC 14.9, plts 102, lactic 7.9> 5.4, Na 128, K 3, Cl 87, bicarb 23, sCr 1.06, BUN 38, albumin < 1.5, corrected Ca 8.6, low protein, AST 106.  ABG notable for PF ratio of 60.  Some labs still pending.  Cultures sent and started on cefepime, vanc, and flagyl in addition to 30 cc/kg LR bolus but still required initiation of vasopressor support.  CT chest/ abd/ pelvis pending.  Patient to be transferred to [facility], PCCM accepting.   ? ?Pertinent  Medical History  ?IVDA/ polysubstance abuse- snorts heroin  ?Tobacco abuse ? ?Significant Hospital Events: ?Including procedures, antibiotic start and stop dates in addition to other pertinent events   ?3/28 [facility] tx to [facility]> septic shock, resp failure, multifocal pna, CT chest/abd/pelvis ? ?3/28 Bcx2 ([facility]) >  ?3/28 trach aspirate > ? ?Interim History / Subjective:  ?Sedated on vent.  On 100% with 10 PEEP, sats high  80s. ? ?Objective   ?Blood pressure 115/68, pulse (!) 106, temperature 97.6 ?F (36.4 ?C), temperature source Axillary, resp. rate (!) 30, height 5\' 11"  (1.803 m), weight 52.1 kg, SpO2 91 %. ?   ?Vent Mode: PRVC ?FiO2 (%):  [100 %] 100 % ?Set Rate:  [25 bmp-30 bmp] 30 bmp ?Vt Set:  [400 mL] 400 mL ?PEEP:  [5 cmH20-10 cmH20] 10 cmH20 ?Plateau Pressure:  [13 cmH20-20 cmH20] 20 cmH20  ? ?Intake/Output Summary (Last 24 hours) at Dec 11, 2021 2312 ?Last data filed at 2021/12/11 2200 ?Gross per 24 hour  ?Intake 593.96 ml  ?Output 1095 ml  ?Net -501.04 ml  ? ?Filed Weights  ? Dec 11, 2021 1646 12-11-21 2220  ?Weight: 52.7 kg 52.1 kg  ? ? ?Examination: ?General: Adult male, chronically ill appearing and now critically ill. ?Neuro: Sedated on vent, unresponsive. ?HEENT: [place]/AT. Sclerae anicteric. ETT in place. ?Cardiovascular: RRR, no M/R/G.  ?Lungs: Respirations even and unlabored.  Coarse bilaterally L > R. ?Abdomen: BS x 4, soft, NT/ND.  ?Musculoskeletal: No gross deformities, no edema.  ?Skin: Multiple tattoos throughout.  Skin otherwise intact, warm, no rashes. ? ? ?Studies:  ?CT C/A/P 3/28 > severe b/l PNA with cavitation throughout LUL, mediastinal adenopathy presumed reactive, pyelonephritis, non-specific periportal edema, trace ascites. ?Echo 3/29 >  ? ?Assessment & Plan:  ? ?Acute hypoxic respiratory failure secondary to multifocal pneumonia - ? Necrotizing pna given cavitations, ? Septic emboli. ?Hx Tobacco abuse.  ?- Full MV support, start proning protocol. ?- VAP prevention protocol/ PPI. ?- Daily  SAT & SBT when appropriate. ?- Empiric broad spectrum abx. ?- Send urine legionella/strep, trach aspirate. ? ?Septic shock secondary to PNA and pyelonephritis - given hx of IVDA, need to rule out bacteremia/ ?IE and given reports of fecal and urinary incontinence> consider MRI to rule out epidural abscess (nothing glaring on CT chest/abd/pelvis). ?- Continue NE for MAP goal > 65. ?- Trend lactate. ?- Check TTE and if neg might  need TEE pending cultures. ? ?Multiple electrolyte disturbances - ? Malnutrition. ?Hyponatremia- hypovolemic  ?Hypokalemia. ?Hypocalcemia.  ?AGMA / lactic acidosis.  ?- Replete K and Ca. ?- Check mag and phos- trend (? High risk for refeeding). ?- Follow BMP. ? ?Urinary and fecal incontinence - ? Abscess with spinal cord compression (nothing noted on CT). ?- Can consider MRI. ? ?Elevated AST/ t.bili - presumed 2/2 sepsis/shock. ?- Recheck LFTs/coags in AM. ? ?Thrombocytopenia - presumed 2/2 sepsis. ?- Trend CBC. ? ?Severe protein calorie malnutrition/ BMI 16 - consider malignancy vs failure to thrive. ?- TF per nutrition, caution as high risk refeeding syndrome. ? ?IVDA/ recent heroin abuse/ polysubstance abuse.  ?- UDS pending. ?- Empiric thiamine /folate, unclear if ETOH. ?- Cessation counseling/ TOC consult when appropriate.  ? ?Best Practice (right click and "Reselect all SmartList Selections" daily)  ? ?Diet/type: NPO ?DVT prophylaxis: not indicated ?GI prophylaxis: PPI ?Lines: Arterial Line ?Foley:  Yes, and it is still needed ?Code Status:  full code ?Last date of multidisciplinary goals of care discussion: None yet. ? ?Labs   ?CBC: ?Recent Labs  ?Lab 12/04/2021 ?1537  ?WBC 14.9*  ?NEUTROABS 10.9*  ?HGB 13.6  ?HCT 39.6  ?MCV 91.7  ?PLT 102*  ? ? ?Basic Metabolic Panel: ?Recent Labs  ?Lab 11/19/2021 ?1537  ?NA 128*  ?K 3.0*  ?CL 87*  ?CO2 23  ?GLUCOSE 124*  ?BUN 38*  ?CREATININE 1.06  ?CALCIUM 6.6*  ? ?GFR: ?Estimated Creatinine Clearance: 62.8 mL/min (by C-G formula based on SCr of 1.06 mg/dL). ?Recent Labs  ?Lab 12/07/2021 ?1537 12/12/2021 ?1726  ?WBC 14.9*  --   ?LATICACIDVEN 7.9* 5.4*  ? ? ?Liver Function Tests: ?Recent Labs  ?Lab 11/27/2021 ?1537  ?AST 106*  ?ALT 38  ?ALKPHOS 54  ?BILITOT 1.5*  ?PROT 4.6*  ?ALBUMIN <1.5*  ? ?No results for input(s): LIPASE, AMYLASE in the last 168 hours. ?No results for input(s): AMMONIA in the last 168 hours. ? ?ABG ?   ?Component Value Date/Time  ? PHART 7.13 (LL) 12/02/2021 2006   ? PCO2ART 89 (HH) 11/16/2021 2006  ? PO2ART 68 (L) 12/06/2021 2006  ? HCO3 29.6 (H) 11/18/2021 2006  ? ACIDBASEDEF 2.2 (H) 11/22/2021 2006  ? O2SAT 90.1 12/07/2021 2006  ?  ? ?Coagulation Profile: ?Recent Labs  ?Lab 11/21/2021 ?1537  ?INR 1.3*  ? ? ?Cardiac Enzymes: ?No results for input(s): CKTOTAL, CKMB, CKMBINDEX, TROPONINI in the last 168 hours. ? ?HbA1C: ?No results found for: HGBA1C ? ?CBG: ?Recent Labs  ?Lab 12/09/21 ?2218  ?GLUCAP 103*  ? ? ?Review of Systems:   ?Unable  ? ?Past Medical History:  ?He,  has no past medical history on file.  ? ?Surgical History:  ?unknown ? ?Social History:  ?  IVDA/ heroin use, otherwise unknown ? ?Family History:  ?His family history is not on file.  ? ?Allergies ?Not on File  ? ?Home Medications  ?Prior to Admission medications   ?Not on File  ?  ? ?Critical care time: 50 min.  ? ? ?Rutherford Guysahul Abisai Deer, PA - C ?[facility] ?For pager details, please see AMION or use Epic chat  ?After 1900, please call [facility] for cross coverage needs ?12-29-21, 11:12 PM ? ? ? ?

## 2021-12-09 NOTE — ED Notes (Signed)
Carelink left with pt 

## 2021-12-09 NOTE — Progress Notes (Signed)
Patient placed in prone position. ETT taped 25 cm @lip .  ?

## 2021-12-09 NOTE — Progress Notes (Signed)
Pharmacy Antibiotic Note ? ?Jonathan Mann is a 46 y.o. male admitted on 11/27/2021 with sepsis, unknown source.  Pharmacy has been consulted for Cefepime and Vancomycin dosing for 7 days. ? ? Cefepime 2gm IV given at 1607 and Vanc 1gm IV given at 1734.  Also received Metronidazole 500 mg IV at 1634. ? ?Plan: ?Cefepime 2gm IV q8hrs x 7 days. ?Vancomycin 1gm IV Q 24 hrs x 6 more days. ?Goal AUC 400-550. ?Expected AUC: 461 ?SCr used: 1.06 ?Will follow renal function, culture data, clinical progress and antibiotic plans. ? ?Height: 5\' 11"  (180.3 cm) ?Weight: 52.7 kg (116 lb 1.6 oz) ?IBW/kg (Calculated) : 75.3 ? ?Temp (24hrs), Avg:97.5 ?F (36.4 ?C), Min:97.3 ?F (36.3 ?C), Max:97.6 ?F (36.4 ?C) ? ?Recent Labs  ?Lab 11/24/2021 ?1537 11/13/2021 ?1726  ?WBC 14.9*  --   ?CREATININE 1.06  --   ?LATICACIDVEN 7.9* 5.4*  ?  ?Estimated Creatinine Clearance: 63.5 mL/min (by C-G formula based on SCr of 1.06 mg/dL).   ? ? ?Antimicrobials this admission: ?Cefepime 3/28 pm >>(4/4 am) ?Vancomycin 3/28 >> (4/3) ?Metronidazole IV x 1 on 3/28  ? ?Dose adjustments this admission: ?n/a ? ?Microbiology results: ? 3/28 blood:  ? 3/28 urine: ? 3/28 COVID and flu: negative ? ?Thank you for allowing pharmacy to be a part of this patient?s care. ? ?4/28, RPh ?11/25/2021 8:27 PM ? ?

## 2021-12-09 NOTE — ED Notes (Signed)
Returned from CT. R.T at bedside to do repeat ABG.  ?

## 2021-12-09 NOTE — Sepsis Progress Note (Signed)
ELink monitoring sepsis protocol 

## 2021-12-09 NOTE — Progress Notes (Signed)
Pharmacy Antibiotic Note ? ?Jonathan Mann is a 46 y.o. male admitted on 12/26/2021 with sepsis.  Pharmacy has been consulted for Zosyn dosing.Already on vancomycin. ? ?Plan: ?Add Zosyn 3.375G IV q8h to be infused over 4 hours ? ? ?Height: 5\' 11"  (180.3 cm) ?Weight: 52.1 kg (114 lb 13.8 oz) ?IBW/kg (Calculated) : 75.3 ? ?Temp (24hrs), Avg:97.5 ?F (36.4 ?C), Min:97.3 ?F (36.3 ?C), Max:97.6 ?F (36.4 ?C) ? ?Recent Labs  ?Lab Dec 26, 2021 ?1537 12/26/2021 ?1726  ?WBC 14.9*  --   ?CREATININE 1.06  --   ?LATICACIDVEN 7.9* 5.4*  ?  ?Estimated Creatinine Clearance: 62.8 mL/min (by C-G formula based on SCr of 1.06 mg/dL).   ? ?Not on File ? ?12/11/21, PharmD, BCPS ?Clinical Pharmacist ?Phone: [phone] ? ? ?

## 2021-12-09 NOTE — ED Notes (Signed)
Pt intubated and sedated. Unable to sign consent for transport. Mother of pt signed on behalf of pt ?

## 2021-12-09 NOTE — Procedures (Signed)
Arterial Catheter Insertion Procedure Note ? ?Jonathan Mann  ?[id number]  ?[birth year] ? ?Date:12/08/2021  ?Time:11:14 PM  ? ? ?Provider Performing: Montey Hora  ? ? ?Procedure: Insertion of Arterial Line [phone]) without US guidance ? ?Indication(s) ?Blood pressure monitoring and/or need for frequent ABGs ? ?Consent ?Unable to obtain consent due to emergent nature of procedure. ? ?Anesthesia ?None ? ? ?Time Out ?Verified patient identification, verified procedure, site/side was marked, verified correct patient position, special equipment/implants available, medications/allergies/relevant history reviewed, required imaging and test results available. ? ? ?Sterile Technique ?Maximal sterile technique including full sterile barrier drape, hand hygiene, sterile gown, sterile gloves, mask, hair covering, sterile ultrasound probe cover (if used). ? ? ?Procedure Description ?Area of catheter insertion was cleaned with chlorhexidine and draped in sterile fashion. Without real-time ultrasound guidance an arterial catheter was placed into the left radial artery.  Appropriate arterial tracings confirmed on monitor.   ? ? ?Complications/Tolerance ?None; patient tolerated the procedure well. ? ? ?EBL ?Minimal ? ? ?Specimen(s) ?None ? ? ?Montey Hora, PA - C ?[facility] ?For pager details, please see AMION or use Epic chat  ?After 1900, please call [facility] for cross coverage needs ?12/05/2021, 11:14 PM ? ? ?

## 2021-12-09 NOTE — Progress Notes (Signed)
eLink Physician-Brief Progress Note ?Patient Name: Jonathan Mann ?DOB: 05-12-1976 ?MRN: [id number] ? ? ?Date of Service ? 11/28/2021  ?HPI/Events of Note ? Patient with severe cavitary pneumonia ans septic shock against a background of IVDA, he has acute hypoxemic respiratory failure, and is on the ventilator.  ?eICU Interventions ? New Patient Evaluation.  ? ? ? ?  ? ?[name] ?11/16/2021, 11:31 PM ?

## 2021-12-09 NOTE — Progress Notes (Signed)
Patient's sats were in 68s.  Moved probe to ear, now sat at 89 to 91%.  Continuing to monitor.  Report called to [facility], RRT at [facility] on [address]. ?

## 2021-12-10 ENCOUNTER — Inpatient Hospital Stay (HOSPITAL_COMMUNITY): Payer: Self-pay

## 2021-12-10 ENCOUNTER — Other Ambulatory Visit (HOSPITAL_COMMUNITY): Payer: Self-pay

## 2021-12-10 DIAGNOSIS — I469 Cardiac arrest, cause unspecified: Secondary | ICD-10-CM

## 2021-12-10 LAB — BASIC METABOLIC PANEL
Anion gap: 15 (ref 5–15)
BUN: 34 mg/dL — ABNORMAL HIGH (ref 6–20)
CO2: 22 mmol/L (ref 22–32)
Calcium: 6.8 mg/dL — ABNORMAL LOW (ref 8.9–10.3)
Chloride: 95 mmol/L — ABNORMAL LOW (ref 98–111)
Creatinine, Ser: 1.2 mg/dL (ref 0.61–1.24)
GFR, Estimated: >60 mL/min (ref >60)
Glucose, Bld: 94 mg/dL (ref 70–99)
Potassium: 4.2 mmol/L (ref 3.5–5.1)
Sodium: 132 mmol/L — ABNORMAL LOW (ref 135–145)

## 2021-12-10 LAB — BLOOD CULTURE ID PANEL (REFLEXED) - BCID2

## 2021-12-10 LAB — HEPATIC FUNCTION PANEL
ALT: 50 U/L — ABNORMAL HIGH (ref 0–44)
AST: 128 U/L — ABNORMAL HIGH (ref 15–41)
Albumin: <1.5 g/dL — ABNORMAL LOW (ref 3.5–5.0)
Alkaline Phosphatase: 48 U/L (ref 38–126)
Bilirubin, Direct: 0.6 mg/dL — ABNORMAL HIGH (ref 0.0–0.2)
Indirect Bilirubin: 0.7 mg/dL (ref 0.3–0.9)
Total Bilirubin: 1.3 mg/dL — ABNORMAL HIGH (ref 0.3–1.2)
Total Protein: 3.1 g/dL — ABNORMAL LOW (ref 6.5–8.1)

## 2021-12-10 LAB — POCT I-STAT 7, (LYTES, BLD GAS, ICA,H+H)
Acid-Base Excess: 1 mmol/L (ref 0.0–2.0)
Acid-Base Excess: 1 mmol/L (ref 0.0–2.0)
Acid-base deficit: 5 mmol/L — ABNORMAL HIGH (ref 0.0–2.0)
Acid-base deficit: 7 mmol/L — ABNORMAL HIGH (ref 0.0–2.0)
Bicarbonate: 29.9 mmol/L — ABNORMAL HIGH (ref 20.0–28.0)
Bicarbonate: 30.4 mmol/L — ABNORMAL HIGH (ref 20.0–28.0)
Bicarbonate: 25.1 mmol/L (ref 20.0–28.0)
Bicarbonate: 22.9 mmol/L (ref 20.0–28.0)
Calcium, Ion: 1.06 mmol/L — ABNORMAL LOW (ref 1.15–1.40)
Calcium, Ion: 1.02 mmol/L — ABNORMAL LOW (ref 1.15–1.40)
Calcium, Ion: 1 mmol/L — ABNORMAL LOW (ref 1.15–1.40)
Calcium, Ion: 0.91 mmol/L — ABNORMAL LOW (ref 1.15–1.40)
HCT: 43 % (ref 39.0–52.0)
HCT: 42 % (ref 39.0–52.0)
HCT: 38 % — ABNORMAL LOW (ref 39.0–52.0)
HCT: 34 % — ABNORMAL LOW (ref 39.0–52.0)
Hemoglobin: 14.6 g/dL (ref 13.0–17.0)
Hemoglobin: 14.3 g/dL (ref 13.0–17.0)
Hemoglobin: 12.9 g/dL — ABNORMAL LOW (ref 13.0–17.0)
Hemoglobin: 11.6 g/dL — ABNORMAL LOW (ref 13.0–17.0)
O2 Saturation: 95 %
O2 Saturation: 80 %
O2 Saturation: 86 %
O2 Saturation: 76 %
Patient temperature: 37
Patient temperature: 38.1
Patient temperature: 37.8
Patient temperature: 39.7
Potassium: 4.1 mmol/L (ref 3.5–5.1)
Potassium: 4.4 mmol/L (ref 3.5–5.1)
Potassium: 4.5 mmol/L (ref 3.5–5.1)
Potassium: 3.8 mmol/L (ref 3.5–5.1)
Sodium: 132 mmol/L — ABNORMAL LOW (ref 135–145)
Sodium: 133 mmol/L — ABNORMAL LOW (ref 135–145)
Sodium: 133 mmol/L — ABNORMAL LOW (ref 135–145)
Sodium: 135 mmol/L (ref 135–145)
TCO2: 32 mmol/L (ref 22–32)
TCO2: 33 mmol/L — ABNORMAL HIGH (ref 22–32)
TCO2: 27 mmol/L (ref 22–32)
TCO2: 25 mmol/L (ref 22–32)
pCO2 arterial: 65.5 mmHg (ref 32–48)
pCO2 arterial: 74.7 mmHg (ref 32–48)
pCO2 arterial: 73.4 mmHg (ref 32–48)
pCO2 arterial: 76.5 mmHg (ref 32–48)
pH, Arterial: 7.267 — ABNORMAL LOW (ref 7.35–7.45)
pH, Arterial: 7.223 — ABNORMAL LOW (ref 7.35–7.45)
pH, Arterial: 7.147 — CL (ref 7.35–7.45)
pH, Arterial: 7.101 — CL (ref 7.35–7.45)
pO2, Arterial: 87 mmHg (ref 83–108)
pO2, Arterial: 58 mmHg — ABNORMAL LOW (ref 83–108)
pO2, Arterial: 72 mmHg — ABNORMAL LOW (ref 83–108)
pO2, Arterial: 65 mmHg — ABNORMAL LOW (ref 83–108)

## 2021-12-10 LAB — HIV ANTIBODY (ROUTINE TESTING W REFLEX): HIV Screen 4th Generation wRfx: NONREACTIVE

## 2021-12-10 LAB — URINALYSIS, ROUTINE W REFLEX MICROSCOPIC
Bilirubin Urine: NEGATIVE
Glucose, UA: NEGATIVE mg/dL
Hgb urine dipstick: NEGATIVE
Ketones, ur: NEGATIVE mg/dL
Leukocytes,Ua: NEGATIVE
Nitrite: NEGATIVE
Protein, ur: 30 mg/dL — AB
Specific Gravity, Urine: 1.04 — ABNORMAL HIGH (ref 1.005–1.030)
pH: 5 (ref 5.0–8.0)

## 2021-12-10 LAB — MRSA NEXT GEN BY PCR, NASAL: MRSA by PCR Next Gen: NOT DETECTED

## 2021-12-10 LAB — GLUCOSE, CAPILLARY
Glucose-Capillary: 51 mg/dL — ABNORMAL LOW (ref 70–99)
Glucose-Capillary: 84 mg/dL (ref 70–99)
Glucose-Capillary: 96 mg/dL (ref 70–99)
Glucose-Capillary: 78 mg/dL (ref 70–99)

## 2021-12-10 LAB — CBC
HCT: 40.1 % (ref 39.0–52.0)
Hemoglobin: 13.6 g/dL (ref 13.0–17.0)
MCH: 31.1 pg (ref 26.0–34.0)
MCHC: 33.9 g/dL (ref 30.0–36.0)
MCV: 91.8 fL (ref 80.0–100.0)
Platelets: UNDETERMINED 10*3/uL (ref 150–400)
RBC: 4.37 MIL/uL (ref 4.22–5.81)
RDW: 14.2 % (ref 11.5–15.5)
WBC: 11.5 10*3/uL — ABNORMAL HIGH (ref 4.0–10.5)
nRBC: 0 % (ref 0.0–0.2)

## 2021-12-10 LAB — MAGNESIUM: Magnesium: 2 mg/dL (ref 1.7–2.4)

## 2021-12-10 LAB — PHOSPHORUS: Phosphorus: 8.4 mg/dL — ABNORMAL HIGH (ref 2.5–4.6)

## 2021-12-10 LAB — PROTIME-INR
INR: 1.5 — ABNORMAL HIGH (ref 0.8–1.2)
Prothrombin Time: 17.7 seconds — ABNORMAL HIGH (ref 11.4–15.2)

## 2021-12-10 LAB — LACTIC ACID, PLASMA: Lactic Acid, Venous: 8.2 mmol/L (ref 0.5–1.9)

## 2021-12-10 LAB — BRAIN NATRIURETIC PEPTIDE: B Natriuretic Peptide: 271.4 pg/mL — ABNORMAL HIGH (ref 0.0–100.0)

## 2021-12-10 MED ORDER — SODIUM CHLORIDE 0.9 % IV SOLN: INTRAVENOUS | Status: DC | PRN

## 2021-12-10 MED ORDER — FENTANYL 2500MCG IN NS 250ML (10MCG/ML) PREMIX INFUSION: 50.0000 ug/h | INTRAVENOUS | Status: DC

## 2021-12-10 MED ORDER — VECURONIUM BROMIDE 10 MG IV SOLR: 0.0000 ug/kg/min | Status: DC

## 2021-12-10 MED ORDER — SODIUM BICARBONATE 8.4 % IV SOLN: 50.0000 meq | Freq: Once | INTRAVENOUS | Status: AC

## 2021-12-10 MED ORDER — PHENYLEPHRINE HCL-NACL 20-0.9 MG/250ML-% IV SOLN
0.0000 ug/min | INTRAVENOUS | Status: DC
  Administered 2021-12-10: 300 ug/min via INTRAVENOUS
  Administered 2021-12-10: 400 ug/min via INTRAVENOUS
  Filled 2021-12-10 (×5): qty 250

## 2021-12-10 MED ORDER — CISATRACURIUM BOLUS VIA INFUSION
0.1000 mg/kg | Freq: Once | INTRAVENOUS | Status: DC
  Filled 2021-12-10: qty 6

## 2021-12-10 MED ORDER — DEXTROSE 50 % IV SOLN
INTRAVENOUS | Status: AC
  Filled 2021-12-10: qty 50

## 2021-12-10 MED ORDER — VASOPRESSIN 20 UNITS/100 ML INFUSION FOR SHOCK
0.0000 [IU]/min | INTRAVENOUS | Status: DC
  Administered 2021-12-10: 0.04 [IU]/min via INTRAVENOUS
  Filled 2021-12-10: qty 300

## 2021-12-10 MED ORDER — SODIUM BICARBONATE 8.4 % IV SOLN
INTRAVENOUS | Status: AC
  Administered 2021-12-10: 50 meq via INTRAVENOUS
  Filled 2021-12-10: qty 50

## 2021-12-10 MED ORDER — FENTANYL 2500MCG IN NS 250ML (10MCG/ML) PREMIX INFUSION
0.0000 ug/h | INTRAVENOUS | Status: DC
  Administered 2021-12-10: 50 ug/h via INTRAVENOUS
  Filled 2021-12-10: qty 250

## 2021-12-10 MED ORDER — ROCURONIUM BROMIDE 10 MG/ML (PF) SYRINGE
PREFILLED_SYRINGE | INTRAVENOUS | Status: AC
  Administered 2021-12-10: 25 mg via INTRAVENOUS
  Filled 2021-12-10: qty 10

## 2021-12-10 MED ORDER — MIDAZOLAM BOLUS VIA INFUSION
1.0000 mg | INTRAVENOUS | Status: DC | PRN
  Filled 2021-12-10: qty 2

## 2021-12-10 MED ORDER — ARTIFICIAL TEARS OPHTHALMIC OINT: 1.0000 "application " | TOPICAL_OINTMENT | Freq: Three times a day (TID) | OPHTHALMIC | Status: DC

## 2021-12-10 MED ORDER — ROCURONIUM BROMIDE 10 MG/ML (PF) SYRINGE: 25.0000 mg | PREFILLED_SYRINGE | Freq: Once | INTRAVENOUS | Status: AC

## 2021-12-10 MED ORDER — VECURONIUM BOLUS VIA INFUSION: 5.0000 mg | Freq: Once | INTRAVENOUS | Status: DC

## 2021-12-10 MED ORDER — EPINEPHRINE HCL 5 MG/250ML IV SOLN IN NS
0.5000 ug/min | INTRAVENOUS | Status: DC
  Administered 2021-12-10: 5 ug/min via INTRAVENOUS
  Filled 2021-12-10: qty 250

## 2021-12-10 MED ORDER — NOREPINEPHRINE 16 MG/250ML-% IV SOLN: 0.0000 ug/min | INTRAVENOUS | Status: DC

## 2021-12-10 MED ORDER — SODIUM BICARBONATE 8.4 % IV SOLN: 25.0000 meq | Freq: Once | INTRAVENOUS | Status: AC

## 2021-12-10 MED ORDER — MIDAZOLAM-SODIUM CHLORIDE 100-0.9 MG/100ML-% IV SOLN: 2.0000 mg/h | INTRAVENOUS | Status: DC

## 2021-12-10 MED ORDER — PHENYLEPHRINE HCL-NACL 20-0.9 MG/250ML-% IV SOLN
INTRAVENOUS | Status: AC
  Administered 2021-12-10: 20 ug/min via INTRAVENOUS
  Filled 2021-12-10: qty 250

## 2021-12-10 MED ORDER — SODIUM BICARBONATE 8.4 % IV SOLN
INTRAVENOUS | Status: AC
  Administered 2021-12-10: 25 meq via INTRAVENOUS
  Filled 2021-12-10: qty 50

## 2021-12-10 MED ORDER — FENTANYL BOLUS VIA INFUSION
50.0000 ug | INTRAVENOUS | Status: DC | PRN
  Filled 2021-12-10: qty 50

## 2021-12-10 MED ORDER — FENTANYL CITRATE (PF) 100 MCG/2ML IJ SOLN: 50.0000 ug | Freq: Once | INTRAMUSCULAR | Status: DC

## 2021-12-10 MED ORDER — CALCIUM GLUCONATE-NACL 1-0.675 GM/50ML-% IV SOLN
1.0000 g | Freq: Once | INTRAVENOUS | Status: AC
  Administered 2021-12-10: 1000 mg via INTRAVENOUS
  Filled 2021-12-10: qty 50

## 2021-12-10 MED ORDER — SODIUM CHLORIDE 0.9 % IV SOLN
0.0000 ug/kg/min | INTRAVENOUS | Status: DC
  Filled 2021-12-10: qty 20

## 2021-12-11 LAB — URINE CULTURE: Culture: NO GROWTH

## 2021-12-12 LAB — CULTURE, RESPIRATORY W GRAM STAIN: Gram Stain: NONE SEEN

## 2021-12-13 LAB — CULTURE, BLOOD (ROUTINE X 2)

## 2021-12-13 NOTE — Death Summary Note (Signed)
?  Name: Jonathan Mann ?MRN: [id number] ?DOB: 08/23/1976 46 y.o. ? ?Date of Admission: 11/13/2021  3:27 PM ?Date of Discharge: December 14, 2021 ?Attending Physician: Lorin Glass, MD ? ?Discharge Diagnosis: ?Principal Problem: ?  Septic shock (HCC) ?Active Problems: ?  Encephalopathy acute ?  Multifocal pneumonia ?  Necrotizing pneumonia (HCC) ?  Hypokalemia ?  Hypocalcemia ?  Severe malnutrition (HCC) ?  Substance abuse (HCC) ? ? ?Cause of death: Asystole  ?Time of death: 08:56 AM 2021/12/04 ? ?Disposition and follow-up:   ?[name] was discharged from [facility] in expired condition.   ? ?Hospital Course: ?Jonathan Mann is a 46 yo male with polysubstance use disorder who presented to [facility] ER with 2 weeks of fatigue and shortness of breath that progressively worsened in the last 24 hours.  Reported primarily snorts heroin, last use was evening of 3/27.  Additionally reported urinary and fecal incontinence, fever, cough, chest pain, and shortness of breath; denied abdominal pain, vomiting, headache.  He arrived by EMS hypotensive, tachycardic, hypoxic, temp 97, and BMI 16.19.  Tried on BiPAP but required intubation for ongoing respiratory distress and worsening hemodynamics.   ?  ?Workup notable for CXR with bilateral infiltrates, extensive on the left c/w with multifocal pneumonia and right apical blebs.  CT chest shows severe bilateral pneumonia with cavitations throughout the left upper lobe.  Labs notable for severe lactic acid, elevated LFT, hypoxia, hypercapnia and severe hypoalbuminemia. ? ?He was started on vancomycin and Zosyn.  Blood culture and TTE was ordered.  He was proned to improve his oxygenation.  His ABG this morning showed worsening hypoxia and hypercapnia despite on maximal vent support and proning.  He required 4 different pressors medications and his MAP was barely above 60.  ? ?I spoke to patient's mother and sister this morning.  I explained the poor  prognosis due to his severe septic shock.  I explained to family that performing CPR for his cardiac arrest would not have any meaningful recovery and may cause more suffering.  Family originally would like to continue full scope of treatment. ? ?Patient went into PEA arrest soon after.  CPR was initiated and 1 round of epi was given.  His mother verbalized stopping resuscitation during CODE BLUE.  Patient regained his pulse shortly after and his mother verbalized no further CPR.  Patient passed away at 08:56 AM 2021/12/14 from asystole. ? ?Signed: ?Doran Stabler, DO ?12/14/2021, 9:18 AM  ?

## 2021-12-13 NOTE — Progress Notes (Signed)
eLink Physician-Brief Progress Note ?Patient Name: Jonathan Mann ?DOB: [birth year] ?MRN: [id number] ? ? ?Date of Service ? 11/15/2021  ?HPI/Events of Note ? I called patient's mother Durene Cal and updated her on patient's critical status, and high risk for death, she is on her way to the hospital, approximately 30 minutes away.  ?eICU Interventions ? See above.  ? ? ? ?  ? ?Thomasene Lot Sarayah Bacchi ?12/07/2021, 7:03 AM ?

## 2021-12-13 NOTE — Progress Notes (Signed)
eLink Physician-Brief Progress Note ?Patient Name: Jonathan Mann ?DOB: Jul 23, 1976 ?MRN: [id number] ? ? ?Date of Service ? Jan 03, 2022  ?HPI/Events of Note ? Lactic acid 8.2, patient has had > 4 liters of iv fluid so far, his volume status is not clear and his respiratory status is extremely tenuous. Left lung essentially White out.  ?eICU Interventions ? Will hold iv fluids and order a stat BNP and echocardiogram to try to estimate volume status prior to additional aggressive volume repletion, given his respiratory status, lactic acid level not withstanding.  ? ? ? ?  ? ?Kerry Kass Rob Mciver ?01-03-22, 6:36 AM ?

## 2021-12-13 NOTE — Progress Notes (Signed)
eLink Physician-Brief Progress Note ?Patient Name: Jonathan Mann ?DOB: 1976-06-07 ?MRN: [id number] ? ? ?Date of Service ? 11/16/2021  ?HPI/Events of Note ? Patient continues to have a profound respiratory acidosis (PH 7.14). VT is 6.5 ml / kg.  ?eICU Interventions ? Respiratory rate increased to 35, ABG at 7:15 AM. (45 minutes).  ? ? ? ?  ? ?Thomasene Lot Chirag Krueger ?11/12/2021, 6:17 AM ?

## 2021-12-13 NOTE — Progress Notes (Signed)
Tracheal aspirate sent to lab

## 2021-12-13 NOTE — Progress Notes (Addendum)
eLink Physician-Brief Progress Note ?Patient Name: Jonathan Mann ?DOB: 12-01-1975 ?MRN: [id number] ? ? ?Date of Service ? 12/08/2021  ?HPI/Events of Note ? Patient is on maximum doses of peripheral gtt Norepinephrine  / Vasopressin , unfortunately he is currently prone and cannot have a central line placed. He needs Propofol sparing sedation to reduce his hypotension risk. Ca++ 1.06. Pulse oximeter saturations have not been tracking but patient had a recent ABG.  ?eICU Interventions ? Phenylephrine gtt added until central line  / PICC line can be placed. Fentanyl gtt ordered to allow weaning of Propofol and maintenance of BP. Calcium gluconate 1 gm iv bolus ordered.. Bicarb 1 amp ordered, Stat ABG ordered to r/o worsening of his metabolic acidosis. Prognosis is grave.  ? ? ? ?  ? ?[name] ?11/28/2021, 2:25 AM ?

## 2021-12-13 NOTE — Plan of Care (Signed)
Interdisciplinary Goals of Care Family Meeting ? ? ?Date carried out:: 11/19/2021 ? ?Location of the meeting: Bedside ? ?Member's involved: Physician, Bedside Registered Nurse, and Family Member or next of kin ? ?Durable Power of Insurance risk surveyor: Durene Cal   ? ?Discussion: We discussed goals of care for Jonathan Mann .   ? ?The Clinical status was relayed to Patient's mother and sister bedside in detail. ?  ?Updated and notified of patients medical condition. ?  ?  ?Patient remains unresponsive and will not open eyes to command.   ?Patient with increased WOB and using accessory muscles to breathe ?Explained to family course of therapy and the modalities  ?Signs of brain damage ?Evidence of kidney failure ?Recommend follow up NEUROLOGY RECS ?  ?Patient with Progressive multiorgan failure with a very high probablity of a very minimal chance of meaningful recovery despite all aggressive and optimal medical therapy. ? ?Code status: Full DNR ? ?Disposition: Continue current acute care ? ?  ?  ?Family are satisfied with Plan of action and management. All questions answered ?  ?Cheri Fowler MD ?[facility] ?See Amion for pager ?If no response to pager, please call [phone] until 7pm ?After 7pm, Please call E-link [phone] ? ?

## 2021-12-13 NOTE — Progress Notes (Signed)
Chaplain paged to code blue in pt's room. Before arrival chaplain was notified pt had expired. Upon arrival, team observed faint heartbeat and respirations in pt.  ? ?While MD was visiting with family for decision about DNR, pt respirations and heartbeat stopped completely.  ? ?Chaplain sat with pt's family as they absorbed the news. Pt's mother Sheralyn Boatman expressed gratitude that she did not have to make a decision on [employer]. Chaplain asked open ended questions to facilitate emotional expression and story telling. Chaplain engaged family in life review and offered support as Sheralyn Boatman shared the news with her husband. ? ?Family reports they are in shock and need some time to absorb this devastating news. Chaplain communicated family's needs with medical team and is on stand by. ? ?Please page as further needs arise. ? ?Maryanna Shape. Carley Hammed, M.Div. B[facility] ?Chaplain ?Pager [phone] ?Office [phone] ? ? ? ? ? 2021/12/14 0840  ?Clinical Encounter Type  ?Visited With Patient;Family  ?Visit Type Initial;Spiritual support;Death;Code  ?Referral From Nurse  ?Spiritual Encounters  ?Spiritual Needs Emotional;Grief support  ?Stress Factors  ?Family Stress Factors Major life changes  ? ? ?

## 2021-12-13 NOTE — Procedures (Signed)
Cardiopulmonary Resuscitation Note ? ?EDU ON  ?[id number]  ?[birth year] ? ?Date:11/27/2021  ?Time:8:59 AM  ? ?Provider Performing:Audia Amick  ? ?Procedure: Cardiopulmonary Resuscitation [phone]) ? ?Indication(s) ?Loss of Pulse ? ?Consent ?N/A ? ?Anesthesia ?N/A ? ? ?Time Out ?N/A ? ? ?Sterile Technique ?Hand hygiene, gloves ? ? ?Procedure Description ?Called to patient's room for CODE BLUE. Initial rhythm was PEA/Asystole. Patient received high quality chest compressions for 2 minutes with defibrillation or cardioversion when appropriate. Epinephrine was administered every 3 minutes as directed by time Biomedical engineer. Return of spontaneous circulation was achieved. ? ?Family at bedside. ? ? ?Complications/Tolerance ?N/A ? ? ?EBL ?N/A ? ? ?Specimen(s) ?N/A ? ?Estimated time to ROSC: ? ?

## 2021-12-13 NOTE — Progress Notes (Signed)
Patient four PIV's. None are ultrasound guided. Patient extremely unstable, was pronged shortly after admission. Dr Nicole Cella came to bedside as patient blood pressure was 70/40. Discussion occurred regarding CVC placement.  CVC not placed due to BP. Vasopressors x 3 infusing. IV watch over sight. Will monitor. ?

## 2021-12-13 NOTE — Progress Notes (Signed)
Critical ABG results called to Elink. ? ? Latest Reference Range & Units December 23, 2021 05:34  ?pH, Arterial 7.35 - 7.45  7.147 (LL)  ?pCO2 arterial 32 - 48 mmHg 73.4 (HH)  ?pO2, Arterial 83 - 108 mmHg 72 (L)  ?TCO2 22 - 32 mmol/L 27  ?Acid-base deficit 0.0 - 2.0 mmol/L 5.0 (H)  ?Bicarbonate 20.0 - 28.0 mmol/L 25.1  ?O2 Saturation % 86  ?Patient temperature  37.8 C  ? ?

## 2021-12-13 NOTE — Progress Notes (Addendum)
? ?NAME:  Jonathan Mann, MRN:  [id number], DOB:  December 01, 1975, LOS: 1 ?ADMISSION DATE:  12/08/2021, CONSULTATION DATE:  11/18/2021 ?REFERRING MD:  Dr. Matilde Sprang, CHIEF COMPLAINT:  SOB  ? ?History of Present Illness:  ?HPI obtained from medical chart review as patient is intubated and sedated on mechanical ventilation.  ? ?46 year old with only known prior history of IV drug abuse and who does follow with regular medical care who presented to [facility] ER with 2.5 weeks of fatigue and shortness of breath but became significantly worse in last 24 hrs.  Reported primarily snorts heroin, last use was evening of 3/27.  Additionally reported urinary and fecal incontinence, fever, cough, chest pain, and shortness of breath; denied abdominal pain, vomiting, headache.  He arrived by EMS hypotensive, tachycardic, hypoxic, temp 97, and BMI 16.19.  Tried on BiPAP but required intubation for ongoing respiratory distress and worsening hemodynamics.   ? ?Workup notable for CXR with bilateral infiltrates, extensive on the left c/w with multifocal pneumonia and right apical blebs.  Labs notable for WBC 14.9, plts 102, lactic 7.9> 5.4, Na 128, K 3, Cl 87, bicarb 23, sCr 1.06, BUN 38, albumin < 1.5, corrected Ca 8.6, low protein, AST 106.  ABG notable for PF ratio of 60.  Some labs still pending.  Cultures sent and started on cefepime, vanc, and flagyl in addition to 30 cc/kg LR bolus but still required initiation of vasopressor support.  CT chest/ abd/ pelvis pending.  Patient to be transferred to [facility], PCCM accepting.   ? ?Pertinent  Medical History  ?IVDA/ polysubstance abuse- snorts heroin  ?Tobacco abuse ? ?Significant Hospital Events: ?Including procedures, antibiotic start and stop dates in addition to other pertinent events   ?3/28 [facility] tx to [facility]> septic shock, resp failure, multifocal pna, CT chest/abd/pelvis ? ?3/28 Bcx2 ([facility]) >  ?3/28 trach aspirate > ? ?Interim History / Subjective:  ?Problem.  Sedated.  Not follow commands. ? ?Tmax  100.9 ?20 Levo, 5 Epi, 300 phenyl ?10 propofol  ?PEEP 14, rate 28 ?ABG 7.147/73/72 ?Lactic 7.9 > 5.4 > 8.2 ?Shock liver ?WBC 11.5 ?Pending blood culture ? ?Objective   ?Blood pressure (!) (P) 89/60, pulse (!) (P) 115, temperature (!) 100.9 ?F (38.3 ?C), resp. rate (!) 35, height _0  (1.803 m), weight 52.1 kg, SpO2 (!) 80 %. ?   ?Vent Mode: PRVC ?FiO2 (%):  [100 %] 100 % ?Set Rate:  [25 bmp-35 bmp] 35 bmp ?Vt Set:  [400 mL-500 mL] 500 mL ?PEEP:  [5 cmH20-14 cmH20] 14 cmH20 ?Plateau Pressure:  [13 PJS31-59 cmH20] 24 cmH20  ? ?Intake/Output Summary (Last 24 hours) at 22-Dec-2021 0724 ?Last data filed at Dec 22, 2021 0500 ?Gross per 24 hour  ?Intake 4152.11 ml  ?Output 1345 ml  ?Net 2807.11 ml  ? ? ?Filed Weights  ? 11/16/2021 1646 11/18/2021 2220 12-22-2021 0500  ?Weight: 52.7 kg 52.1 kg 52.1 kg  ? ? ?Examination: ?General: Adult male, failure, acutely ill ?Neuro: Sedated on vent, unresponsive. ?HEENT: [place]/AT. Sclerae anicteric. ETT in place. ?Cardiovascular: RRR, no M/R/G.  ?Lungs: Respirations even and unlabored posterior lungs.  No wheezing  ?Musculoskeletal: No gross deformities, no edema.  No track marks ?Skin: Multiple tattoos throughout.  Skin otherwise intact, warm, no rashes. ? ? ?Studies:  ?CT C/A/P 3/28 > severe b/l PNA with cavitation throughout LUL, mediastinal adenopathy presumed reactive, pyelonephritis, non-specific periportal edema, trace ascites. ?Echo 3/29 >  ? ?Assessment & Plan:  ?Acute hypoxic respiratory failure secondary to multifocal pneumonia  ?Severe  hypercapnia ?Necrotizing pna given cavitations in the setting of heroin snorting. ?ABG showed worsening hypoxia and hypercapnia despite on maximal vent support.  Cannot add paralytics given worsening septic shock. ?- Full MV support, start proning protocol. ?- VAP prevention protocol/ PPI. ?- Empiric broad spectrum abx with Vanc and Zosyn ?- Send urine legionella/strep, trach aspirate. ? ?Septic shock secondary to PNA and pyelonephritis  ?Severe lactic  acidosis  ?Pending blood culture . High risk of bacteremia and endocarditis given possible IVDA. ?He is maxed out on all pressors medications.  MAP is barely above 60.  High risk for cardiac arrest. ?- Trend lactate. ?- Continue pressor support ?- Continue broad spectrum abx ?- Pending TTE ?- Follow blood culture  ? ?Elevated LFT - presumed 2/2 sepsis/shock. ?- Trend LFT ? ?Thrombocytopenia - presumed 2/2 sepsis. ?- Trend CBC. ? ?Severe protein calorie malnutrition/ BMI 16 ?Patient has no teeth which affects his ability to eat. ?- TF per nutrition, caution as high risk refeeding syndrome. ? ?Polysubstance use disorder ?- UDS pending. ?- Empiric thiamine /folate, unclear if ETOH. ?- Cessation counseling/ TOC consult when appropriate.  ? ?Goals of care ?I met with patient's mother and sister in patient's room.  I explained the poor prognosis with severe septic shock.  He is maxed out on all pressor support and his risk of going to cardiac arrest is extremely high.  I explained to them that performing CPR for his cardiac arrest would not have any meaningful recovery and may cause more suffering.  Family verbalized understanding and would like to continue full scope of treatment.  His father's is on the way from [place]. ? ?Best Practice (right click and "Reselect all SmartList Selections" daily)  ? ?Diet/type: NPO ?DVT prophylaxis: not indicated ?GI prophylaxis: PPI ?Lines: Arterial Line ?Foley:  Yes, and it is still needed ?Code Status:  full code ?Last date of multidisciplinary goals of care discussion: 3/29 ? ?Labs   ?CBC: ?Recent Labs  ?Lab 11/13/2021 ?1537 12/05/2021 ?2303 01-04-22 ?0104 Jan 04, 2022 ?0256 January 04, 2022 ?9476 Jan 04, 2022 ?5465  ?WBC 14.9*  --   --  11.5*  --   --   ?NEUTROABS 10.9*  --   --   --   --   --   ?HGB 13.6 13.6 14.6 13.6 14.3 12.9*  ?HCT 39.6 40.0 43.0 40.1 42.0 38.0*  ?MCV 91.7  --   --  91.8  --   --   ?PLT 102*  --   --  PLATELET CLUMPS NOTED ON SMEAR, UNABLE TO ESTIMATE  --   --   ? ? ? ?Basic  Metabolic Panel: ?Recent Labs  ?Lab 12/07/2021 ?1537 11/29/2021 ?2303 January 04, 2022 ?0104 Jan 04, 2022 ?0354 04-Jan-2022 ?6568 January 04, 2022 ?1275  ?NA 128* 133* 132* 133* 132* 133*  ?K 3.0* 3.7 4.1 4.4 4.2 4.5  ?CL 87*  --   --   --  95*  --   ?CO2 23  --   --   --  22  --   ?GLUCOSE 124*  --   --   --  94  --   ?BUN 38*  --   --   --  34*  --   ?CREATININE 1.06  --   --   --  1.20  --   ?CALCIUM 6.6*  --   --   --  6.8*  --   ?MG  --   --   --   --  2.0  --   ?PHOS  --   --   --   --  8.4*  --   ? ? ?GFR: ?Estimated Creatinine Clearance: 55.5 mL/min (by C-G formula based on SCr of 1.2 mg/dL). ?Recent Labs  ?Lab 11/27/2021 ?1537 12/12/2021 ?1726 31-Dec-2021 ?0256 12/31/2021 ?5631  ?WBC 14.9*  --  11.5*  --   ?LATICACIDVEN 7.9* 5.4*  --  8.2*  ? ? ? ?Liver Function Tests: ?Recent Labs  ?Lab 11/14/2021 ?1537 31-Dec-2021 ?0533  ?AST 106* 128*  ?ALT 38 50*  ?ALKPHOS 54 48  ?BILITOT 1.5* 1.3*  ?PROT 4.6* 3.1*  ?ALBUMIN <1.5* <1.5*  ? ? ?No results for input(s): LIPASE, AMYLASE in the last 168 hours. ?No results for input(s): AMMONIA in the last 168 hours. ? ?ABG ?   ?Component Value Date/Time  ? PHART 7.147 (LL) 2021/12/31 0534  ? PCO2ART 73.4 ([place]) Dec 31, 2021 0534  ? PO2ART 72 (L) 12/31/21 0534  ? HCO3 25.1 December 31, 2021 0534  ? TCO2 27 12-31-21 0534  ? ACIDBASEDEF 5.0 (H) 12/31/2021 0534  ? O2SAT 86 December 31, 2021 0534  ? ?  ? ?Coagulation Profile: ?Recent Labs  ?Lab 11/28/2021 ?1537 31-Dec-2021 ?0256  ?INR 1.3* 1.5*  ? ? ? ?Cardiac Enzymes: ?No results for input(s): CKTOTAL, CKMB, CKMBINDEX, TROPONINI in the last 168 hours. ? ?HbA1C: ?No results found for: HGBA1C ? ?CBG: ?Recent Labs  ?Lab 11/30/2021 ?2218 12/31/21 ?4970 12-31-21 ?2637 31-Dec-2021 ?0527  ?GLUCAP 103* 51* 84 96  ? ? ? ?Review of Systems:   ?Unable  ? ?Past Medical History:  ?He,  has no past medical history on file.  ? ?Surgical History:  ?unknown ? ?Social History:  ?  IVDA/ heroin use, otherwise unknown ? ?Family History:  ?His family history is not on file.  ? ?Allergies ?Not on File  ? ?Home  Medications  ?Prior to Admission medications   ?Not on File  ?  ? ?Critical care time:  ? ?Gaylan Gerold, DO ? ? ?

## 2021-12-13 NOTE — Progress Notes (Incomplete)
?  Echocardiogram ?2D Echocardiogram has been attempted. Pt prone. Nurse asked to come back later. Will try again as time permits. ? ?Fidel Levy ?12-15-21, 7:49 AM ?

## 2021-12-13 NOTE — Progress Notes (Addendum)
MD aware vasopressors titrated out side of parameters due to hypotension. ?

## 2021-12-13 NOTE — Progress Notes (Signed)
RT note. ?Patient head turned from LT to RT without any complications noted. RT able to pass cath down ETT. ? ?RT unable to pick up spo2 at this time. Tried multiple places and different probes, RT will continue to try, and continue to monitor.  ?

## 2021-12-13 NOTE — Progress Notes (Signed)
Following shift change, patient blood pressure continued to drop despite increased vasopressor support, family called to bedside, vasopressors were at maximum limits. MD notified RT and RN to supine patient to prepare for possible chest compressions. Following supine positioning, patient had no pulse and code blue called at at approximately 0846 with approximately 2 minutes of CPR and 1 Epinephrine IV push administered, family came to bedside and said to "just stop." MD Dr. Merrily Pew made patient a DNR and code blue was stopped at approximately 0847. ? ?Patient time of death 3. ? ?RNs, Wallene Huh & Kellie Shropshire auscultated no heart or breath sounds at 0856. ? ?Darrold Span ? ?

## 2021-12-13 NOTE — Progress Notes (Signed)
Increased RR to 35. Repeat ABG at 07:15. ?

## 2021-12-13 NOTE — Progress Notes (Signed)
eLink Physician-Brief Progress Note ?Patient Name: Jonathan Mann ?DOB: 03-06-1976 ?MRN: [id number] ? ? ?Date of Service ? 12-20-2021  ?HPI/Events of Note ? ABG reviewed.  ?eICU Interventions ? Dr. Earlie Server of PCCM ground crew is in the room addressing the ABG.  ? ? ? ?  ? ?Kerry Kass Valia Wingard ?Dec 20, 2021, 3:35 AM ?

## 2021-12-13 DEATH — deceased

## 2021-12-14 LAB — CULTURE, BLOOD (ROUTINE X 2)
Culture: NO GROWTH
Special Requests: ADEQUATE

## 2022-01-13 MED FILL — Medication: Qty: 1 | Status: AC

## 2023-08-25 IMAGING — DX DG CHEST 1V PORT
2 series · 2 of 2 positions shown · non-contrast
Comparison: December 09, 2021.

CLINICAL DATA: Respiratory failure, septic shock.

EXAM:
PORTABLE CHEST 1 VIEW

[chest pa grid (1 of 2)]
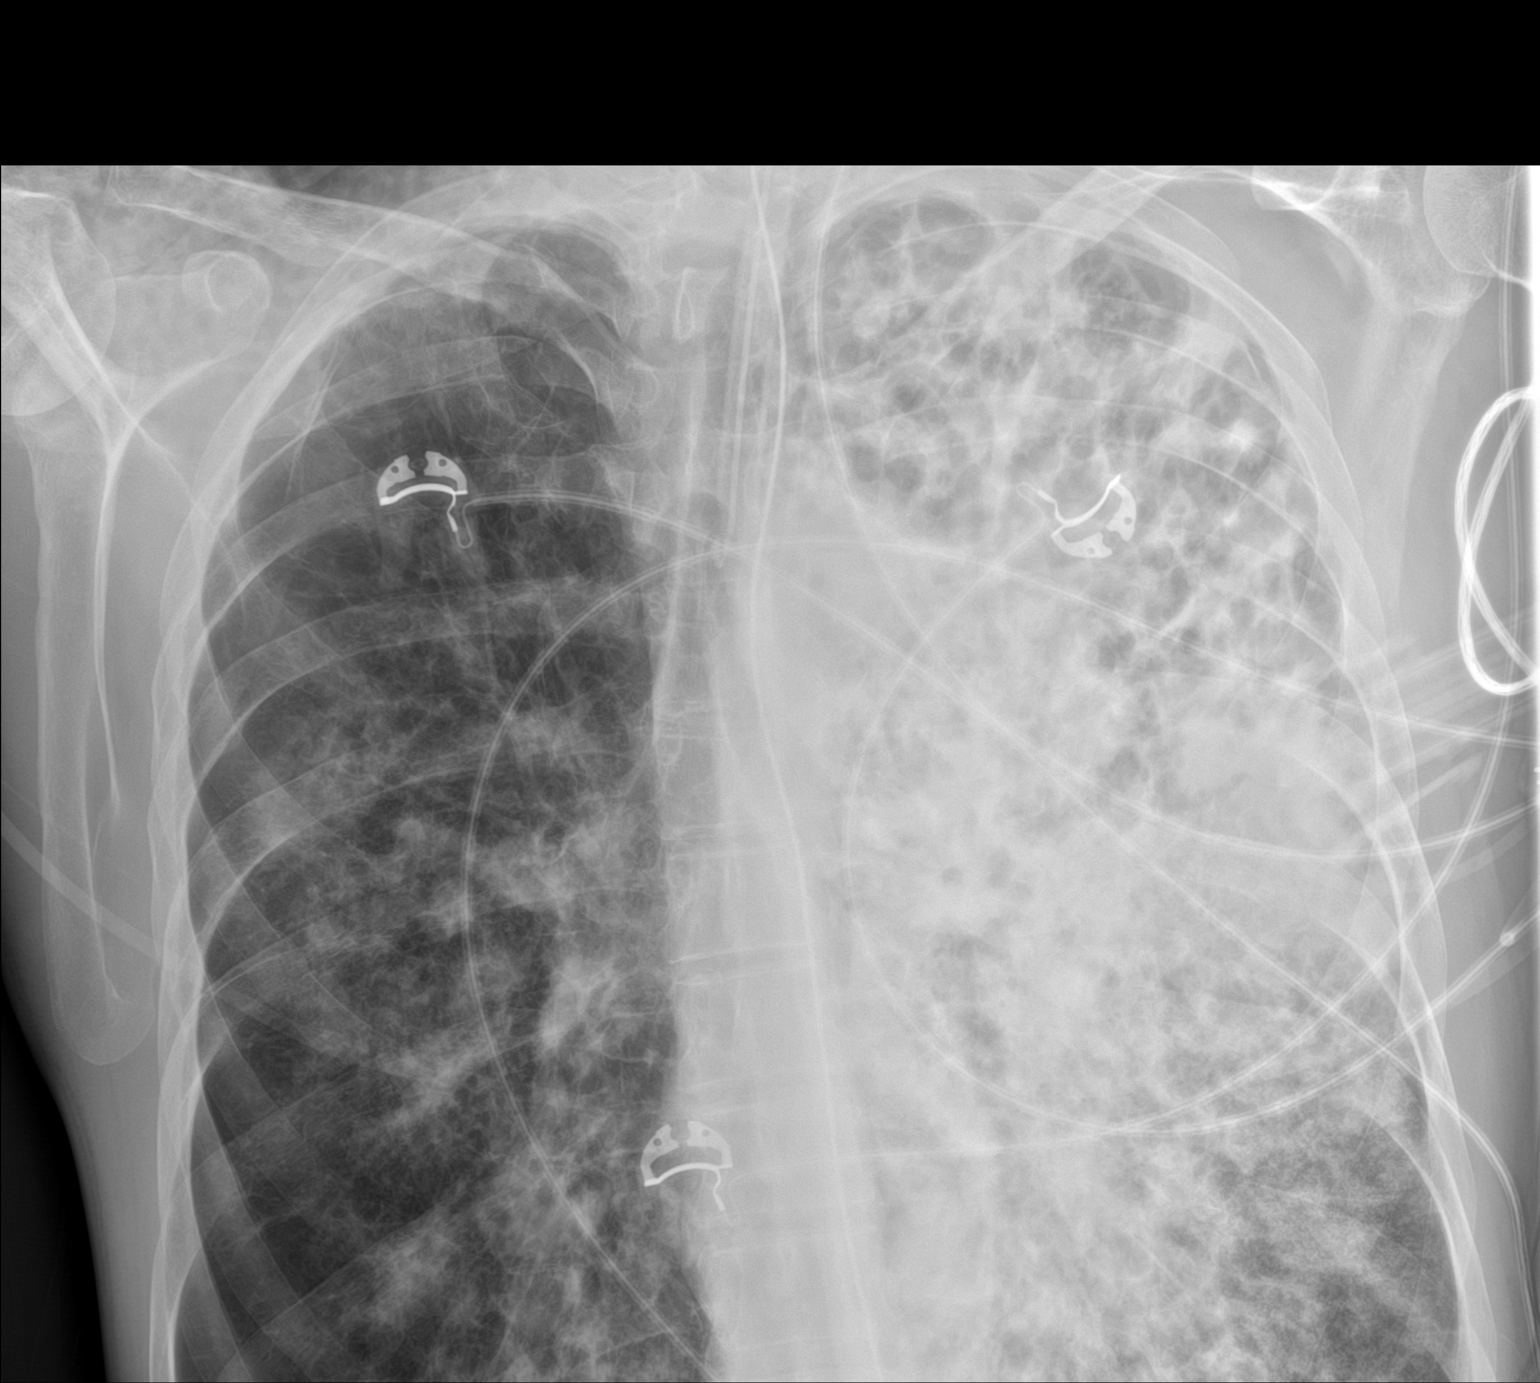

[chest pa grid (2 of 2)]
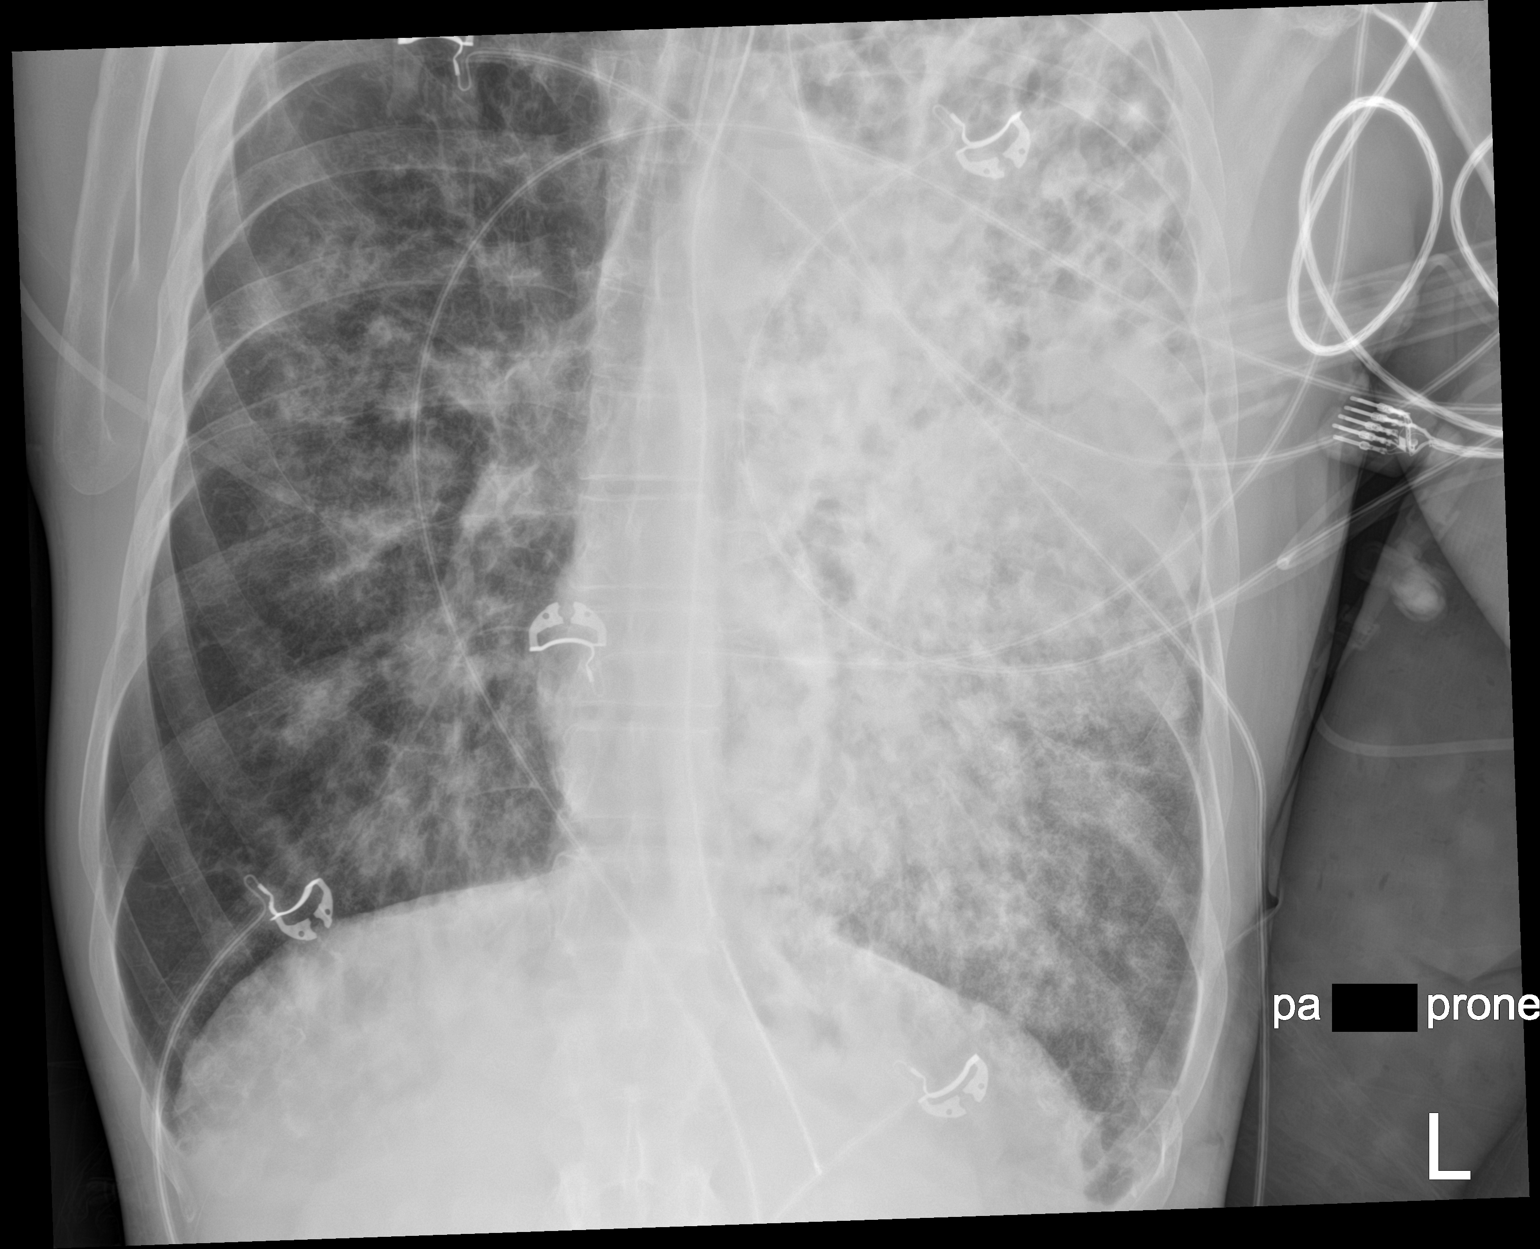

[2 of 2 positions shown; findings below may reference images not displayed]

FINDINGS: The heart size and mediastinal contours are within normal limits.
Stable large left lung opacity is noted with smaller opacity seen in
right lung consistent with multifocal pneumonia. Endotracheal tube
is in grossly good position. Distal tip of nasogastric tube is seen
in proximal stomach. The visualized skeletal structures are
unremarkable.
IMPRESSION: Stable bilateral lung opacities are noted, left greater than right,
consistent with multifocal pneumonia. Support apparatus in grossly
good position.

## 2024-02-24 ENCOUNTER — Encounter (HOSPITAL_COMMUNITY): Payer: Self-pay | Admitting: Internal Medicine
